# Patient Record
Sex: Male | Born: 1985 | Race: White | Hispanic: No | Marital: Single | Smoking: Never smoker
Health system: Southern US, Community
[De-identification: ages and names within clinical notes are randomized; demographics above are authoritative.]

---
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
## 2007-11-21 ENCOUNTER — Emergency Department: Payer: Self-pay | Admitting: Emergency Medicine

## 2008-05-14 ENCOUNTER — Emergency Department: Payer: Self-pay | Admitting: Emergency Medicine

## 2011-03-12 ENCOUNTER — Emergency Department: Payer: Self-pay | Admitting: Emergency Medicine

## 2017-07-09 ENCOUNTER — Encounter: Payer: Self-pay | Admitting: *Deleted

## 2017-07-09 ENCOUNTER — Emergency Department: Payer: Self-pay

## 2017-07-09 ENCOUNTER — Emergency Department
Admission: EM | Admit: 2017-07-09 | Discharge: 2017-07-09 | Disposition: A | Discharge status: Home / Self Care | Payer: Self-pay | Attending: Student in an Organized Health Care Education/Training Program | Admitting: Student in an Organized Health Care Education/Training Program

## 2017-07-09 DIAGNOSIS — W230XXA Caught, crushed, jammed, or pinched between moving objects, initial encounter: Secondary | ICD-10-CM | POA: Insufficient documentation

## 2017-07-09 DIAGNOSIS — S62642A Nondisplaced fracture of proximal phalanx of right middle finger, initial encounter for closed fracture: Secondary | ICD-10-CM | POA: Insufficient documentation

## 2017-07-09 DIAGNOSIS — S62652A Nondisplaced fracture of medial phalanx of right middle finger, initial encounter for closed fracture: Secondary | ICD-10-CM

## 2017-07-09 DIAGNOSIS — Y9289 Other specified places as the place of occurrence of the external cause: Secondary | ICD-10-CM | POA: Insufficient documentation

## 2017-07-09 DIAGNOSIS — S6721XA Crushing injury of right hand, initial encounter: Secondary | ICD-10-CM | POA: Insufficient documentation

## 2017-07-09 DIAGNOSIS — Y990 Civilian activity done for income or pay: Secondary | ICD-10-CM | POA: Insufficient documentation

## 2017-07-09 DIAGNOSIS — Y9389 Activity, other specified: Secondary | ICD-10-CM | POA: Insufficient documentation

## 2017-07-09 MED ORDER — MELOXICAM 7.5 MG PO TABS: 7.5000 mg | ORAL_TABLET | Freq: Every day | ORAL | 0 refills | Status: AC

## 2017-07-09 MED ORDER — HYDROCODONE-ACETAMINOPHEN 5-325 MG PO TABS: 1.0000 | ORAL_TABLET | Freq: Four times a day (QID) | ORAL | 0 refills | Status: AC | PRN

## 2017-07-09 MED ORDER — HYDROCODONE-ACETAMINOPHEN 5-325 MG PO TABS
2.0000 | ORAL_TABLET | Freq: Once | ORAL | Status: AC
  Administered 2017-07-09: 2 via ORAL
  Filled 2017-07-09: qty 2

## 2017-07-09 NOTE — ED Notes (Signed)
Pt spouse signed paperwork due to pt injury to dominant hand

## 2017-07-09 NOTE — Discharge Instructions (Signed)
Ice and elevate right hand to reduce swelling. Take Norco as needed for pain every 6 hours. He may not drive or operate machinery while taking this medication. Also meloxicam 1 daily for inflammation. Call and make an appointment with Dr. Allena KatzPatel at [facility]. The splint on until seen by the orthopedist. A note was written for you to give to your workplace stating that you would have limited use of your right hand due to injury.

## 2017-07-09 NOTE — ED Triage Notes (Signed)
Pt's right hand got caught in between a vice at work, hand is swollen and painful with a few abrasions

## 2017-07-09 NOTE — ED Notes (Signed)
See triage note  States he got his right hand caught between a machine part and a vice  Swelling noted across knuckles    Good pulses and sensation

## 2017-07-09 NOTE — ED Provider Notes (Signed)
[facility] Emergency Department Provider Note   ____________________________________________   First MD Initiated Contact with Patient 07/09/17 1255     (approximate)  I have reviewed the triage vital signs and the nursing notes.   HISTORY  Chief Complaint Hand Injury   HPI Dale Mack is a 31 y.o. male is here after having a injury at work. Patient got his hand caught by a type of fight prescription on a machine prior to his arrival.Patient has several abrasions. He states that initially he could move all his fingers without difficulty but now that they're swollen and is more painful and difficult. Currently rates his pain as 10 over 10.  History reviewed. No pertinent past medical history.  There are no active problems to display for this patient.   History reviewed. No pertinent surgical history.  Prior to Admission medications   Medication Sig Start Date End Date Taking? Authorizing Provider  HYDROcodone-acetaminophen (NORCO/VICODIN) 5-325 MG tablet Take 1 tablet by mouth every 6 (six) hours as needed for moderate pain. 07/09/17   Tommi Rumps, PA-C  meloxicam (MOBIC) 7.5 MG tablet Take 1 tablet (7.5 mg total) by mouth daily. 07/09/17 07/09/18  Tommi Rumps, PA-C    Allergies Patient has no known allergies.  No family history on file.  Social History Social History  Substance Use Topics  . Smoking status: Not on file  . Smokeless tobacco: Not on file  . Alcohol use Not on file    Review of Systems Constitutional: No fever/chills Cardiovascular: Denies chest pain. Respiratory: Denies shortness of breath. Gastrointestinal:  No nausea, no vomiting.   Musculoskeletal: Positive right hand pain. Skin: Positive for superficial abrasions right hand. Neurological: Negative for  focal weakness or numbness. ____________________________________________   PHYSICAL EXAM:  VITAL SIGNS: ED Triage Vitals  Enc Vitals Group     BP  07/09/17 1215 118/74     Pulse Rate 07/09/17 1215 (!) 52     Resp 07/09/17 1215 20     Temp 07/09/17 1215 98 F (36.7 C)     Temp Source 07/09/17 1215 Oral     SpO2 07/09/17 1215 98 %     Weight 07/09/17 1217 160 lb (72.6 kg)     Height 07/09/17 1217  (1.676 m)     Head Circumference --      Peak Flow --      Pain Score 07/09/17 1215 10     Pain Loc --      Pain Edu? --      Excl. in GC? --    Constitutional: Alert and oriented. Well appearing and in no acute distress. Eyes: Conjunctivae are normal.  Head: Atraumatic. Neck: No stridor.   Cardiovascular: Normal rate, regular rhythm. Grossly normal heart sounds.  Good peripheral circulation. Respiratory: Normal respiratory effort.  No retractions. Lungs CTAB. Musculoskeletal: There is soft tissue use swelling present and multiple digits on the right hand. There is some superficial abrasions noted to the distal digits without active bleeding. Capillary refill is present and less than 3 seconds. Motor sensory function intact. Patient is able to flex and extend to some degree due to the swelling. The third digit is more tender and swollen in comparison to the others. Neurologic:  Normal speech and language. No gross focal neurologic deficits are appreciated.  Skin:  Skin is warm, dry. Abrasions as noted above. Psychiatric: Mood and affect are normal. Speech and behavior are normal.  ____________________________________________   LABS (all  labs ordered are listed, but only abnormal results are displayed)  Labs Reviewed - No data to display   RADIOLOGY  Dg Hand Complete Right  Result Date: 07/09/2017 CLINICAL DATA:  Injured with hand stuck in vice today, pain around the metacarpal heads EXAM: RIGHT HAND - COMPLETE 3+ VIEW COMPARISON:  Right hand films of 11/21/2007 FINDINGS: There is a small fracture fragment from the base of the middle phalanx of the right third digit on the palmar aspect. No other acute abnormality is seen.  Joint spaces appear normal. The carpal bones are in normal position IMPRESSION: Small fracture fragment from the base of the middle phalanx of the right third digit. Electronically Signed   By: Dwyane DeePaul  Barry M.D.   On: 07/09/2017 13:23    ____________________________________________   PROCEDURES  Procedure(s) performed: None  Procedures  Critical Care performed: No  ____________________________________________   INITIAL IMPRESSION / ASSESSMENT AND PLAN / ED COURSE  Pertinent labs & imaging results that were available during my care of the patient were reviewed by me and considered in my medical decision making (see chart for details).  Patient was given information for crush injury and also we talked about his fracture of his right middle finger. He is follow-up with Dr. Allena KatzPatel. Until that time he is to ice and elevate his hand to reduce swelling. He was placed in a volar OCL splint. Patient was given Norco in the department also with a prescription. He was given a note to take to work stating he has limited use of his right hand due to his injury.   ___________________________________________   FINAL CLINICAL IMPRESSION(S) / ED DIAGNOSES  Final diagnoses:  Crushing injury of right hand, initial encounter  Closed nondisplaced fracture of middle phalanx of right middle finger, initial encounter      NEW MEDICATIONS STARTED DURING THIS VISIT:  Discharge Medication List as of 07/09/2017  2:23 PM    START taking these medications   Details  HYDROcodone-acetaminophen (NORCO/VICODIN) 5-325 MG tablet Take 1 tablet by mouth every 6 (six) hours as needed for moderate pain., Starting Thu 07/09/2017, Print    meloxicam (MOBIC) 7.5 MG tablet Take 1 tablet (7.5 mg total) by mouth daily., Starting Thu 07/09/2017, Until Fri 07/09/2018, Print         Note:  This document was prepared using Dragon voice recognition software and may include unintentional dictation errors.    Tommi RumpsSummers,  Jaidev Sanger L, PA-C 07/09/17 1556    Willy Eddyobinson, Patrick, MD 07/10/17 [phone]

## 2018-11-22 ENCOUNTER — Other Ambulatory Visit: Payer: Self-pay

## 2018-11-22 ENCOUNTER — Emergency Department
Admission: EM | Admit: 2018-11-22 | Discharge: 2018-11-22 | Disposition: A | Discharge status: Home / Self Care | Payer: Self-pay | Attending: Emergency Medicine | Admitting: Emergency Medicine

## 2018-11-22 DIAGNOSIS — J209 Acute bronchitis, unspecified: Secondary | ICD-10-CM

## 2018-11-22 MED ORDER — IPRATROPIUM-ALBUTEROL 0.5-2.5 (3) MG/3ML IN SOLN
3.0000 mL | Freq: Once | RESPIRATORY_TRACT | Status: AC
  Administered 2018-11-22: 3 mL via RESPIRATORY_TRACT
  Filled 2018-11-22: qty 3

## 2018-11-22 MED ORDER — NAPROXEN 500 MG PO TABS: 500.0000 mg | ORAL_TABLET | Freq: Two times a day (BID) | ORAL | 0 refills | Status: AC

## 2018-11-22 MED ORDER — ALBUTEROL SULFATE HFA 108 (90 BASE) MCG/ACT IN AERS: 2.0000 | INHALATION_SPRAY | RESPIRATORY_TRACT | 0 refills | Status: AC | PRN

## 2018-11-22 MED ORDER — PREDNISONE 20 MG PO TABS: 40.0000 mg | ORAL_TABLET | Freq: Every day | ORAL | 0 refills | Status: AC

## 2018-11-22 MED ORDER — PREDNISONE 20 MG PO TABS
60.0000 mg | ORAL_TABLET | Freq: Once | ORAL | Status: AC
  Administered 2018-11-22: 60 mg via ORAL
  Filled 2018-11-22: qty 3

## 2018-11-22 NOTE — ED Provider Notes (Signed)
[facility] Emergency Department Provider Note  ____________________________________________  Time seen: Approximately 5:41 AM  I have reviewed the triage vital signs and the nursing notes.   HISTORY  Chief Complaint Cough    HPI Dale Mack is a 33 y.o. male with no significant past medical history who complains of frequent cough.  Denies any significant chest pain or shortness of breath.  No fevers or chills.  No vomiting and diarrhea, but the frequent cough is associated with causing some discomfort or posttussive emesis.  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   Symptoms are intermittent, worse in cold air, no alleviating factors.  Denies radiating pain.     Past medical history noncontributory  Past surgical history noncontributory   There are no active problems to display for this patient.       Prior to Admission medications   Medication Sig Start Date End Date Taking? Authorizing Provider  albuterol (PROVENTIL HFA) 108 (90 Base) MCG/ACT inhaler Inhale 2 puffs into the lungs every 4 (four) hours as needed for wheezing or shortness of breath. 11/22/18   Sharman Cheek, MD  HYDROcodone-acetaminophen (NORCO/VICODIN) 5-325 MG tablet Take 1 tablet by mouth every 6 (six) hours as needed for moderate pain. 07/09/17   Tommi Rumps, PA-C  naproxen (NAPROSYN) 500 MG tablet Take 1 tablet (500 mg total) by mouth 2 (two) times daily with a meal. 11/22/18   Sharman Cheek, MD  predniSONE (DELTASONE) 20 MG tablet Take 2 tablets (40 mg total) by mouth daily. 11/22/18   Sharman Cheek, MD  No prior medications   Allergies Patient has no known allergies.   No family history on file.  Social History Social History   Tobacco Use  . Smoking status: Not on file  Substance Use Topics  . Alcohol use: Not on file  . Drug use: Not on file  No tobacco or alcohol  use  Review of Systems  Constitutional:   No fever or chills.  ENT:   Positive rhinorrhea.  Positive sore throat. Cardiovascular:   No chest pain or syncope. Respiratory:   No dyspnea positive cough. Gastrointestinal:   Negative for abdominal pain, vomiting and diarrhea.  Musculoskeletal:   Negative for focal pain or swelling All other systems reviewed and are negative except as documented above in ROS and HPI.  ____________________________________________   PHYSICAL EXAM:  VITAL SIGNS: ED Triage Vitals  Enc Vitals Group     BP 11/22/18 0353 124/87     Pulse Rate 11/22/18 0353 65     Resp 11/22/18 0353 16     Temp 11/22/18 0353 98.1 F (36.7 C)     Temp Source 11/22/18 0353 Oral     SpO2 11/22/18 0353 98 %     Weight 11/22/18 0354 160 lb (72.6 kg)     Height 11/22/18 0354 5\' 10"  (1.778 m)     Head Circumference --      Peak Flow --      Pain Score 11/22/18 0354 0     Pain Loc --      Pain Edu? --      Excl. in GC? --     Vital signs reviewed, nursing assessments reviewed.   Constitutional:   Alert and oriented. Non-toxic appearance.   Eyes:   Conjunctivae are normal. EOMI. PERRL. ENT      Head:   Normocephalic and atraumatic.  Nose:   Positive rhinorrhea.       Mouth/Throat:   MMM, positive pharyngeal erythema.  Positive tonsillar erythema without peritonsillar mass.       Neck:   No meningismus. Full ROM. Hematological/Lymphatic/Immunilogical:   No cervical lymphadenopathy. Cardiovascular:   RRR. Symmetric bilateral radial and DP pulses.  No murmurs. Cap refill less than 2 seconds. Respiratory:   Normal respiratory effort without tachypnea/retractions. Breath sounds are clear and equal bilaterally. No wheezes/rales/rhonchi.  There is inducible wheezing with FEV1 maneuver. Gastrointestinal:   Soft and nontender. Non distended. There is no CVA tenderness.  No rebound, rigidity, or guarding. Musculoskeletal:   Normal range of motion in all extremities. No joint  effusions.  No lower extremity tenderness.  No edema. Neurologic:   Normal speech and language.  Motor grossly intact. No acute focal neurologic deficits are appreciated.  Skin:    Skin is warm, dry and intact. No rash noted.  No petechiae, purpura, or bullae.  ____________________________________________    LABS (pertinent positives/negatives) (all labs ordered are listed, but only abnormal results are displayed) Labs Reviewed - No data to display ____________________________________________   EKG    ____________________________________________    RADIOLOGY  No results found.  ____________________________________________   PROCEDURES Procedures  ____________________________________________    CLINICAL IMPRESSION / ASSESSMENT AND PLAN / ED COURSE  Pertinent labs & imaging results that were available during my care of the patient were reviewed by me and considered in my medical decision making (see chart for details).    Patient presents with cough and evidence of bronchospasm consistent with acute bronchitis.  The history of multiple sick contacts in his workplace also consistent with a viral respiratory illness.  No fever, vital signs normal without the use of antipyretics.  Lung exam is not suggestive of consolidation.  Antibiotics not indicated at this time.  Will treat with prednisone and albuterol.  NSAIDs.  Follow-up with primary care.  Work note provided.  Doubt ACS PE dissection AAA carditis or pneumothorax.      ____________________________________________   FINAL CLINICAL IMPRESSION(S) / ED DIAGNOSES    Final diagnoses:  Acute bronchitis, unspecified organism     ED Discharge Orders         Ordered    albuterol (PROVENTIL HFA) 108 (90 Base) MCG/ACT inhaler  Every 4 hours PRN     11/22/18 0540    predniSONE (DELTASONE) 20 MG tablet  Daily     11/22/18 0540    naproxen (NAPROSYN) 500 MG tablet  2 times daily with meals     11/22/18 0540           Portions of this note were generated with dragon dictation software. Dictation errors may occur despite best attempts at proofreading.   Sharman CheekStafford, Britnay Magnussen, MD 11/22/18 [phone]

## 2018-11-22 NOTE — ED Triage Notes (Signed)
Pt states a week of cough. Pt states he is "vomiting up mucus". Pt appears in no acute distress, masked in triage. Pt denies abdominal pain, diarrhea known fever.

## 2020-05-20 ENCOUNTER — Emergency Department (HOSPITAL_COMMUNITY): Payer: Self-pay

## 2020-05-20 ENCOUNTER — Observation Stay (HOSPITAL_COMMUNITY)
Admission: EM | Admit: 2020-05-20 | Discharge: 2020-05-21 | Disposition: A | Discharge status: Home / Self Care | Payer: Self-pay | Attending: Orthopedic Surgery | Admitting: Orthopedic Surgery

## 2020-05-20 ENCOUNTER — Emergency Department (HOSPITAL_COMMUNITY): Payer: Self-pay | Admitting: Anesthesiology

## 2020-05-20 ENCOUNTER — Encounter (HOSPITAL_COMMUNITY): Payer: Self-pay

## 2020-05-20 ENCOUNTER — Encounter (HOSPITAL_COMMUNITY)
Admission: EM | Disposition: A | Discharge status: Home / Self Care | Payer: Self-pay | Source: Home / Self Care | Attending: Emergency Medicine

## 2020-05-20 ENCOUNTER — Other Ambulatory Visit: Payer: Self-pay

## 2020-05-20 DIAGNOSIS — S52392A Other fracture of shaft of radius, left arm, initial encounter for closed fracture: Principal | ICD-10-CM | POA: Insufficient documentation

## 2020-05-20 DIAGNOSIS — S52292A Other fracture of shaft of left ulna, initial encounter for closed fracture: Secondary | ICD-10-CM | POA: Insufficient documentation

## 2020-05-20 DIAGNOSIS — S52202B Unspecified fracture of shaft of left ulna, initial encounter for open fracture type I or II: Secondary | ICD-10-CM | POA: Diagnosis present

## 2020-05-20 DIAGNOSIS — Y9389 Activity, other specified: Secondary | ICD-10-CM | POA: Insufficient documentation

## 2020-05-20 DIAGNOSIS — Y929 Unspecified place or not applicable: Secondary | ICD-10-CM | POA: Insufficient documentation

## 2020-05-20 DIAGNOSIS — S5292XB Unspecified fracture of left forearm, initial encounter for open fracture type I or II: Secondary | ICD-10-CM

## 2020-05-20 DIAGNOSIS — Y999 Unspecified external cause status: Secondary | ICD-10-CM | POA: Insufficient documentation

## 2020-05-20 DIAGNOSIS — S52302B Unspecified fracture of shaft of left radius, initial encounter for open fracture type I or II: Secondary | ICD-10-CM | POA: Diagnosis present

## 2020-05-20 DIAGNOSIS — W19XXXA Unspecified fall, initial encounter: Secondary | ICD-10-CM

## 2020-05-20 DIAGNOSIS — Z20822 Contact with and (suspected) exposure to covid-19: Secondary | ICD-10-CM | POA: Insufficient documentation

## 2020-05-20 HISTORY — PX: I & D EXTREMITY: SHX5045

## 2020-05-20 HISTORY — PX: ORIF ULNAR FRACTURE: SHX5417

## 2020-05-20 LAB — CBC WITH DIFFERENTIAL/PLATELET
Abs Immature Granulocytes: 0.05 10*3/uL (ref 0.00–0.07)
Basophils Absolute: 0 10*3/uL (ref 0.0–0.1)
Basophils Relative: 0 %
Eosinophils Absolute: 0 10*3/uL (ref 0.0–0.5)
Eosinophils Relative: 0 %
HCT: 42.9 % (ref 39.0–52.0)
Hemoglobin: 15.3 g/dL (ref 13.0–17.0)
Immature Granulocytes: 0 %
Lymphocytes Relative: 7 %
Lymphs Abs: 1 10*3/uL (ref 0.7–4.0)
MCH: 29.5 pg (ref 26.0–34.0)
MCHC: 35.7 g/dL (ref 30.0–36.0)
MCV: 82.8 fL (ref 80.0–100.0)
Monocytes Absolute: 1.1 10*3/uL — ABNORMAL HIGH (ref 0.1–1.0)
Monocytes Relative: 7 %
Neutro Abs: 12.4 10*3/uL — ABNORMAL HIGH (ref 1.7–7.7)
Neutrophils Relative %: 86 %
Platelets: 236 10*3/uL (ref 150–400)
RBC: 5.18 MIL/uL (ref 4.22–5.81)
RDW: 12.3 % (ref 11.5–15.5)
WBC: 14.6 10*3/uL — ABNORMAL HIGH (ref 4.0–10.5)
nRBC: 0 % (ref 0.0–0.2)

## 2020-05-20 LAB — BASIC METABOLIC PANEL
Anion gap: 8 (ref 5–15)
BUN: 12 mg/dL (ref 6–20)
CO2: 20 mmol/L — ABNORMAL LOW (ref 22–32)
Calcium: 7.7 mg/dL — ABNORMAL LOW (ref 8.9–10.3)
Chloride: 109 mmol/L (ref 98–111)
Creatinine, Ser: 1.16 mg/dL (ref 0.61–1.24)
GFR calc Af Amer: >60 mL/min (ref >60)
GFR calc non Af Amer: >60 mL/min (ref >60)
Glucose, Bld: 133 mg/dL — ABNORMAL HIGH (ref 70–99)
Potassium: 4.2 mmol/L (ref 3.5–5.1)
Sodium: 137 mmol/L (ref 135–145)

## 2020-05-20 LAB — ETHANOL: Alcohol, Ethyl (B): <10 mg/dL (ref <10)

## 2020-05-20 LAB — PROTIME-INR
INR: 1.1 (ref 0.8–1.2)
Prothrombin Time: 13.5 seconds (ref 11.4–15.2)

## 2020-05-20 LAB — SARS CORONAVIRUS 2 BY RT PCR (HOSPITAL ORDER, PERFORMED IN ~~LOC~~ HOSPITAL LAB): SARS Coronavirus 2: NEGATIVE

## 2020-05-20 SURGERY — IRRIGATION AND DEBRIDEMENT EXTREMITY
Anesthesia: General | Site: Wrist | Laterality: Left

## 2020-05-20 MED ORDER — METHOCARBAMOL 1000 MG/10ML IJ SOLN
500.0000 mg | Freq: Four times a day (QID) | INTRAVENOUS | Status: DC | PRN
  Filled 2020-05-20: qty 5

## 2020-05-20 MED ORDER — METOCLOPRAMIDE HCL 5 MG PO TABS: 5.0000 mg | ORAL_TABLET | Freq: Three times a day (TID) | ORAL | Status: DC | PRN

## 2020-05-20 MED ORDER — TETANUS-DIPHTH-ACELL PERTUSSIS 5-2.5-18.5 LF-MCG/0.5 IM SUSP
0.5000 mL | Freq: Once | INTRAMUSCULAR | Status: AC
  Administered 2020-05-20: 0.5 mL via INTRAMUSCULAR
  Filled 2020-05-20: qty 0.5

## 2020-05-20 MED ORDER — CHLORHEXIDINE GLUCONATE 4 % EX LIQD
60.0000 mL | Freq: Once | CUTANEOUS | Status: DC
  Filled 2020-05-20: qty 60

## 2020-05-20 MED ORDER — ARTIFICIAL TEARS OPHTHALMIC OINT
TOPICAL_OINTMENT | OPHTHALMIC | Status: AC
  Filled 2020-05-20: qty 3.5

## 2020-05-20 MED ORDER — SUCCINYLCHOLINE CHLORIDE 20 MG/ML IJ SOLN
INTRAMUSCULAR | Status: DC | PRN
Start: 2020-05-20 — End: 2020-05-20
  Administered 2020-05-20: 120 mg via INTRAVENOUS

## 2020-05-20 MED ORDER — MIDAZOLAM HCL 2 MG/2ML IJ SOLN
INTRAMUSCULAR | Status: AC
  Filled 2020-05-20: qty 2

## 2020-05-20 MED ORDER — BUPIVACAINE HCL (PF) 0.25 % IJ SOLN
INTRAMUSCULAR | Status: AC
  Filled 2020-05-20: qty 30

## 2020-05-20 MED ORDER — SODIUM CHLORIDE 0.9 % IR SOLN
Status: DC | PRN
  Administered 2020-05-20: 3000 mL

## 2020-05-20 MED ORDER — LACTATED RINGERS IV SOLN: INTRAVENOUS | Status: DC | PRN

## 2020-05-20 MED ORDER — CEFAZOLIN SODIUM-DEXTROSE 2-4 GM/100ML-% IV SOLN: 2.0000 g | INTRAVENOUS | Status: DC

## 2020-05-20 MED ORDER — MIDAZOLAM HCL 5 MG/5ML IJ SOLN
INTRAMUSCULAR | Status: DC | PRN
  Administered 2020-05-20: 2 mg via INTRAVENOUS

## 2020-05-20 MED ORDER — FENTANYL CITRATE (PF) 100 MCG/2ML IJ SOLN
INTRAMUSCULAR | Status: DC | PRN
  Administered 2020-05-20 (×3): 50 ug via INTRAVENOUS

## 2020-05-20 MED ORDER — PHENYLEPHRINE HCL-NACL 10-0.9 MG/250ML-% IV SOLN
INTRAVENOUS | Status: DC | PRN
  Administered 2020-05-20: 25 ug/min via INTRAVENOUS

## 2020-05-20 MED ORDER — PHENYLEPHRINE 40 MCG/ML (10ML) SYRINGE FOR IV PUSH (FOR BLOOD PRESSURE SUPPORT)
PREFILLED_SYRINGE | INTRAVENOUS | Status: AC
  Filled 2020-05-20: qty 20

## 2020-05-20 MED ORDER — POVIDONE-IODINE 10 % EX SWAB: 2.0000 "application " | Freq: Once | CUTANEOUS | Status: DC

## 2020-05-20 MED ORDER — PROMETHAZINE HCL 25 MG/ML IJ SOLN: 6.2500 mg | INTRAMUSCULAR | Status: DC | PRN

## 2020-05-20 MED ORDER — DEXAMETHASONE SODIUM PHOSPHATE 10 MG/ML IJ SOLN
INTRAMUSCULAR | Status: DC | PRN
  Administered 2020-05-20: 5 mg via INTRAVENOUS

## 2020-05-20 MED ORDER — EPHEDRINE SULFATE 50 MG/ML IJ SOLN
INTRAMUSCULAR | Status: DC | PRN
Start: 2020-05-20 — End: 2020-05-20
  Administered 2020-05-20: 5 mg via INTRAVENOUS

## 2020-05-20 MED ORDER — ONDANSETRON HCL 4 MG/2ML IJ SOLN
INTRAMUSCULAR | Status: DC | PRN
  Administered 2020-05-20: 4 mg via INTRAVENOUS

## 2020-05-20 MED ORDER — MORPHINE SULFATE (PF) 2 MG/ML IV SOLN: 0.5000 mg | INTRAVENOUS | Status: DC | PRN

## 2020-05-20 MED ORDER — CLONIDINE HCL (ANALGESIA) 100 MCG/ML EP SOLN
EPIDURAL | Status: DC | PRN
  Administered 2020-05-20: 100 ug

## 2020-05-20 MED ORDER — FENTANYL CITRATE (PF) 250 MCG/5ML IJ SOLN
INTRAMUSCULAR | Status: AC
  Filled 2020-05-20: qty 5

## 2020-05-20 MED ORDER — METRONIDAZOLE IN NACL 5-0.79 MG/ML-% IV SOLN
500.0000 mg | Freq: Three times a day (TID) | INTRAVENOUS | Status: AC
  Administered 2020-05-20 – 2020-05-21 (×3): 500 mg via INTRAVENOUS
  Filled 2020-05-20 (×3): qty 100

## 2020-05-20 MED ORDER — ACETAMINOPHEN 500 MG PO TABS: 1000.0000 mg | ORAL_TABLET | Freq: Once | ORAL | Status: DC

## 2020-05-20 MED ORDER — LIDOCAINE 2% (20 MG/ML) 5 ML SYRINGE
INTRAMUSCULAR | Status: AC
  Filled 2020-05-20: qty 5

## 2020-05-20 MED ORDER — EPHEDRINE 5 MG/ML INJ
INTRAVENOUS | Status: AC
  Filled 2020-05-20: qty 10

## 2020-05-20 MED ORDER — NAPROXEN 250 MG PO TABS
250.0000 mg | ORAL_TABLET | Freq: Two times a day (BID) | ORAL | Status: DC
  Administered 2020-05-21 (×2): 250 mg via ORAL
  Filled 2020-05-20 (×2): qty 1

## 2020-05-20 MED ORDER — ONDANSETRON HCL 4 MG PO TABS: 4.0000 mg | ORAL_TABLET | Freq: Four times a day (QID) | ORAL | Status: DC | PRN

## 2020-05-20 MED ORDER — PROPOFOL 10 MG/ML IV BOLUS
INTRAVENOUS | Status: DC | PRN
  Administered 2020-05-20: 180 mg via INTRAVENOUS

## 2020-05-20 MED ORDER — FENTANYL CITRATE (PF) 100 MCG/2ML IJ SOLN: 25.0000 ug | INTRAMUSCULAR | Status: DC | PRN

## 2020-05-20 MED ORDER — PHENYLEPHRINE HCL (PRESSORS) 10 MG/ML IV SOLN
INTRAVENOUS | Status: DC | PRN
  Administered 2020-05-20 (×3): 40 ug via INTRAVENOUS
  Administered 2020-05-20: 120 ug via INTRAVENOUS
  Administered 2020-05-20 (×2): 80 ug via INTRAVENOUS

## 2020-05-20 MED ORDER — DOCUSATE SODIUM 100 MG PO CAPS
100.0000 mg | ORAL_CAPSULE | Freq: Two times a day (BID) | ORAL | Status: DC
  Administered 2020-05-21: 100 mg via ORAL
  Filled 2020-05-20: qty 1

## 2020-05-20 MED ORDER — PROPOFOL 10 MG/ML IV BOLUS
INTRAVENOUS | Status: AC
  Filled 2020-05-20: qty 40

## 2020-05-20 MED ORDER — HYDROMORPHONE HCL 1 MG/ML IJ SOLN
1.0000 mg | Freq: Once | INTRAMUSCULAR | Status: AC
  Administered 2020-05-20: 1 mg via INTRAVENOUS
  Filled 2020-05-20: qty 1

## 2020-05-20 MED ORDER — CEFAZOLIN SODIUM-DEXTROSE 2-4 GM/100ML-% IV SOLN
2.0000 g | Freq: Once | INTRAVENOUS | Status: AC
  Administered 2020-05-20: 2 g via INTRAVENOUS
  Filled 2020-05-20: qty 100

## 2020-05-20 MED ORDER — HYDROCODONE-ACETAMINOPHEN 7.5-325 MG PO TABS
1.0000 | ORAL_TABLET | ORAL | Status: DC | PRN
  Administered 2020-05-21: 1 via ORAL
  Filled 2020-05-20: qty 1

## 2020-05-20 MED ORDER — METHOCARBAMOL 500 MG PO TABS: 500.0000 mg | ORAL_TABLET | Freq: Four times a day (QID) | ORAL | Status: DC | PRN

## 2020-05-20 MED ORDER — SUCCINYLCHOLINE CHLORIDE 200 MG/10ML IV SOSY
PREFILLED_SYRINGE | INTRAVENOUS | Status: AC
  Filled 2020-05-20: qty 10

## 2020-05-20 MED ORDER — METOCLOPRAMIDE HCL 5 MG/ML IJ SOLN: 5.0000 mg | Freq: Three times a day (TID) | INTRAMUSCULAR | Status: DC | PRN

## 2020-05-20 MED ORDER — ROPIVACAINE HCL 5 MG/ML IJ SOLN
INTRAMUSCULAR | Status: DC | PRN
  Administered 2020-05-20: 30 mL via PERINEURAL

## 2020-05-20 MED ORDER — ACETAMINOPHEN 325 MG PO TABS: 325.0000 mg | ORAL_TABLET | Freq: Four times a day (QID) | ORAL | Status: DC | PRN

## 2020-05-20 MED ORDER — ONDANSETRON HCL 4 MG/2ML IJ SOLN: 4.0000 mg | Freq: Four times a day (QID) | INTRAMUSCULAR | Status: DC | PRN

## 2020-05-20 MED ORDER — DEXAMETHASONE SODIUM PHOSPHATE 10 MG/ML IJ SOLN
INTRAMUSCULAR | Status: AC
  Filled 2020-05-20: qty 1

## 2020-05-20 MED ORDER — CEFAZOLIN SODIUM-DEXTROSE 2-4 GM/100ML-% IV SOLN
2.0000 g | Freq: Three times a day (TID) | INTRAVENOUS | Status: AC
  Administered 2020-05-20 – 2020-05-21 (×3): 2 g via INTRAVENOUS
  Filled 2020-05-20 (×3): qty 100

## 2020-05-20 MED ORDER — HYDROCODONE-ACETAMINOPHEN 5-325 MG PO TABS: 1.0000 | ORAL_TABLET | ORAL | Status: DC | PRN

## 2020-05-20 MED ORDER — SODIUM CHLORIDE 0.9 % IR SOLN
Status: DC | PRN
  Administered 2020-05-20: 1000 mL

## 2020-05-20 MED ORDER — ONDANSETRON HCL 4 MG/2ML IJ SOLN
INTRAMUSCULAR | Status: AC
  Filled 2020-05-20: qty 2

## 2020-05-20 MED ORDER — GENTAMICIN SULFATE 40 MG/ML IJ SOLN
1.5000 mg/kg | Freq: Once | INTRAVENOUS | Status: AC
  Administered 2020-05-20: 100 mg via INTRAVENOUS
  Filled 2020-05-20: qty 2.5

## 2020-05-20 MED ORDER — LIDOCAINE 2% (20 MG/ML) 5 ML SYRINGE
INTRAMUSCULAR | Status: DC | PRN
  Administered 2020-05-20: 80 mg via INTRAVENOUS

## 2020-05-20 MED ORDER — ACETAMINOPHEN 500 MG PO TABS
500.0000 mg | ORAL_TABLET | Freq: Four times a day (QID) | ORAL | Status: AC
  Administered 2020-05-20 – 2020-05-21 (×4): 500 mg via ORAL
  Filled 2020-05-20 (×4): qty 1

## 2020-05-20 SURGICAL SUPPLY — 79 items
BIT DRILL 2.5X110 QC LCP DISP (BIT) ×3 IMPLANT
BLADE SURG 10 STRL SS (BLADE) ×3 IMPLANT
BNDG COHESIVE 4X5 TAN STRL (GAUZE/BANDAGES/DRESSINGS) ×3 IMPLANT
BNDG CONFORM 3 STRL LF (GAUZE/BANDAGES/DRESSINGS) ×3 IMPLANT
BNDG ELASTIC 3X5.8 VLCR STR LF (GAUZE/BANDAGES/DRESSINGS) ×3 IMPLANT
BNDG ELASTIC 4X5.8 VLCR STR LF (GAUZE/BANDAGES/DRESSINGS) ×3 IMPLANT
BNDG ESMARK 4X9 LF (GAUZE/BANDAGES/DRESSINGS) ×3 IMPLANT
CORD BIPOLAR FORCEPS 12FT (ELECTRODE) ×3 IMPLANT
COVER SURGICAL LIGHT HANDLE (MISCELLANEOUS) ×3 IMPLANT
CUFF TOURN SGL QUICK 18X4 (TOURNIQUET CUFF) ×3 IMPLANT
CUFF TOURN SGL QUICK 24 (TOURNIQUET CUFF)
CUFF TRNQT CYL 24X4X16.5-23 (TOURNIQUET CUFF) IMPLANT
DRAPE IMP U-DRAPE 54X76 (DRAPES) IMPLANT
DRAPE INCISE IOBAN 66X45 STRL (DRAPES) ×12 IMPLANT
DRAPE OEC MINIVIEW 54X84 (DRAPES) ×3 IMPLANT
DRAPE SURG 17X23 STRL (DRAPES) ×3 IMPLANT
DRAPE U-SHAPE 47X51 STRL (DRAPES) ×3 IMPLANT
DRSG ADAPTIC 3X8 NADH LF (GAUZE/BANDAGES/DRESSINGS) ×3 IMPLANT
DRSG XEROFORM 1X8 (GAUZE/BANDAGES/DRESSINGS) ×3 IMPLANT
DURAPREP 26ML APPLICATOR (WOUND CARE) ×3 IMPLANT
ELECT CAUTERY BLADE 6.4 (BLADE) ×3 IMPLANT
ELECT REM PT RETURN 9FT ADLT (ELECTROSURGICAL)
ELECTRODE REM PT RTRN 9FT ADLT (ELECTROSURGICAL) IMPLANT
GAUZE SPONGE 4X4 12PLY STRL (GAUZE/BANDAGES/DRESSINGS) ×3 IMPLANT
GLOVE BIO SURGEON STRL SZ7.5 (GLOVE) ×3 IMPLANT
GLOVE BIOGEL M 8.0 STRL (GLOVE) ×9 IMPLANT
GLOVE BIOGEL PI IND STRL 8 (GLOVE) ×2 IMPLANT
GLOVE BIOGEL PI INDICATOR 8 (GLOVE) ×4
GLOVE SS BIOGEL STRL SZ 8 (GLOVE) ×1 IMPLANT
GLOVE SUPERSENSE BIOGEL SZ 8 (GLOVE) ×2
GOWN STRL REUS W/ TWL LRG LVL3 (GOWN DISPOSABLE) ×2 IMPLANT
GOWN STRL REUS W/ TWL XL LVL3 (GOWN DISPOSABLE) ×1 IMPLANT
GOWN STRL REUS W/TWL LRG LVL3 (GOWN DISPOSABLE) ×4
GOWN STRL REUS W/TWL XL LVL3 (GOWN DISPOSABLE) ×3
KIT BASIN OR (CUSTOM PROCEDURE TRAY) ×3 IMPLANT
KIT TURNOVER KIT B (KITS) ×3 IMPLANT
MANIFOLD NEPTUNE II (INSTRUMENTS) ×3 IMPLANT
NEEDLE 22X1 1/2 (OR ONLY) (NEEDLE) IMPLANT
NS IRRIG 1000ML POUR BTL (IV SOLUTION) ×6 IMPLANT
PACK ORTHO EXTREMITY (CUSTOM PROCEDURE TRAY) ×3 IMPLANT
PAD ARMBOARD 7.5X6 YLW CONV (MISCELLANEOUS) ×6 IMPLANT
PAD CAST 4YDX4 CTTN HI CHSV (CAST SUPPLIES) ×1 IMPLANT
PADDING CAST ABS 4INX4YD NS (CAST SUPPLIES) ×4
PADDING CAST ABS COTTON 4X4 ST (CAST SUPPLIES) ×2 IMPLANT
PADDING CAST COTTON 4X4 STRL (CAST SUPPLIES) ×3
PLATE LCP 3.5 7H 98 (Plate) ×3 IMPLANT
PROS LCP PLATE 6H 85MM (Plate) ×3 IMPLANT
PROSTHESIS LCP PLATE 6H 85MM (Plate) ×1 IMPLANT
SCREW CORTEX 3.5 12MM (Screw) ×2 IMPLANT
SCREW CORTEX 3.5 14MM (Screw) ×10 IMPLANT
SCREW CORTEX 3.5 16MM (Screw) ×12 IMPLANT
SCREW LOCK CORT ST 3.5X12 (Screw) ×1 IMPLANT
SCREW LOCK CORT ST 3.5X14 (Screw) ×5 IMPLANT
SCREW LOCK CORT ST 3.5X16 (Screw) ×6 IMPLANT
SET CYSTO W/LG BORE CLAMP LF (SET/KITS/TRAYS/PACK) ×3 IMPLANT
SLING ARM FOAM STRAP LRG (SOFTGOODS) ×3 IMPLANT
SOL PREP POV-IOD 4OZ 10% (MISCELLANEOUS) ×3 IMPLANT
SOL PREP PROV IODINE SCRUB 4OZ (MISCELLANEOUS) ×3 IMPLANT
SPLINT FIBERGLASS 3X12 (CAST SUPPLIES) ×3 IMPLANT
SPONGE LAP 18X18 RF (DISPOSABLE) ×6 IMPLANT
STOCKINETTE IMPERVIOUS 9X36 MD (GAUZE/BANDAGES/DRESSINGS) ×3 IMPLANT
SUT PROLENE 4 0 PS 2 18 (SUTURE) ×12 IMPLANT
SUT VIC AB 2-0 CT1 36 (SUTURE) IMPLANT
SUT VIC AB 3-0 CT1 27 (SUTURE) ×6
SUT VIC AB 3-0 CT1 TAPERPNT 27 (SUTURE) ×2 IMPLANT
SUT VIC AB 3-0 FS2 27 (SUTURE) IMPLANT
SWAB CULTURE ESWAB REG 1ML (MISCELLANEOUS) IMPLANT
SYR BULB IRRIG 60ML STRL (SYRINGE) ×3 IMPLANT
SYR CONTROL 10ML LL (SYRINGE) IMPLANT
TOWEL GREEN STERILE (TOWEL DISPOSABLE) ×3 IMPLANT
TOWEL GREEN STERILE FF (TOWEL DISPOSABLE) ×3 IMPLANT
TRAP DIGIT (INSTRUMENTS) ×6 IMPLANT
TUBE CONNECTING 12'X1/4 (SUCTIONS) ×1
TUBE CONNECTING 12X1/4 (SUCTIONS) ×2 IMPLANT
TUBE EVACUATION TLS (MISCELLANEOUS) IMPLANT
TUBE FEEDING ENTERAL 5FR 16IN (TUBING) IMPLANT
UNDERPAD 30X36 HEAVY ABSORB (UNDERPADS AND DIAPERS) ×6 IMPLANT
WATER STERILE IRR 1000ML POUR (IV SOLUTION) ×3 IMPLANT
YANKAUER SUCT BULB TIP NO VENT (SUCTIONS) ×3 IMPLANT

## 2020-05-20 NOTE — Anesthesia Procedure Notes (Signed)
Procedure Name: Intubation Date/Time: 05/20/2020 4:09 PM Performed by: Edmonia Caprio, CRNA Pre-anesthesia Checklist: Patient identified, Emergency Drugs available, Suction available, Patient being monitored and Timeout performed Patient Re-evaluated:Patient Re-evaluated prior to induction Oxygen Delivery Method: Circle system utilized Preoxygenation: Pre-oxygenation with 100% oxygen Induction Type: IV induction and Rapid sequence Laryngoscope Size: Miller and 2 Grade View: Grade II Tube type: Oral Tube size: 7.5 mm Number of attempts: 1 Airway Equipment and Method: Stylet Placement Confirmation: ETT inserted through vocal cords under direct vision,  positive ETCO2 and breath sounds checked- equal and bilateral Secured at: 23 cm Tube secured with: Tape Dental Injury: Teeth and Oropharynx as per pre-operative assessment

## 2020-05-20 NOTE — ED Notes (Signed)
Ortho tech aware of the need for splint.

## 2020-05-20 NOTE — Anesthesia Procedure Notes (Signed)
Anesthesia Regional Block: Supraclavicular block   Pre-Anesthetic Checklist: ,, timeout performed, Correct Patient, Correct Site, Correct Laterality, Correct Procedure, Correct Position, site marked, Risks and benefits discussed,  Surgical consent,  Pre-op evaluation,  At surgeon's request and post-op pain management  Laterality: Left  Prep: chloraprep       Needles:  Injection technique: Single-shot  Needle Type: Echogenic Needle     Needle Length: 9cm  Needle Gauge: 21     Additional Needles:   Procedures:,,,, ultrasound used (permanent image in chart),,,,  Narrative:  Start time: 05/20/2020 3:47 PM End time: 05/20/2020 3:57 PM Injection made incrementally with aspirations every 5 mL.  Performed by: Personally  Anesthesiologist: Cecile Hearing, MD  Additional Notes: No pain on injection. No increased resistance to injection. Injection made in 5cc increments.  Good needle visualization.  Patient tolerated procedure well.
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
## 2020-05-20 NOTE — ED Notes (Signed)
ED Provider at bedside. 

## 2020-05-20 NOTE — Progress Notes (Signed)
abx given for open left both bone forearm fracture.  Please place in sugartong splint.  Will need urgent I and D with internal fixation.   Will see patient in short stay once covid test complete and images obtrained.

## 2020-05-20 NOTE — Transfer of Care (Signed)
Immediate Anesthesia Transfer of Care Note  Patient: Dale Mack  Procedure(s) Performed: IRRIGATION AND DEBRIDEMENT LEFT ARM (Left Wrist) OPEN REDUCTION INTERNAL FIXATION (ORIF) RADIUS/ULNAR FRACTURE (Left Wrist)  Patient Location: PACU  Anesthesia Type:General  Level of Consciousness: awake, alert  and oriented  Airway & Oxygen Therapy: Patient Spontanous Breathing and Patient connected to nasal cannula oxygen  Post-op Assessment: Report given to RN and Post -op Vital signs reviewed and stable  Post vital signs: Reviewed and stable  Last Vitals:  Vitals Value Taken Time  BP    Temp    Pulse    Resp    SpO2      Last Pain:  Vitals:   05/20/20 1359  TempSrc:   PainSc: 5          Complications: No complications documented.

## 2020-05-20 NOTE — Progress Notes (Signed)
Orthopedic Tech Progress Note Patient Details:  Dale Mack 1986-01-05 [id number]  Ortho Devices Type of Ortho Device: Sugartong splint Ortho Device/Splint Location: ULE Ortho Device/Splint Interventions: Application, Ordered   Post Interventions Patient Tolerated: Well Instructions Provided: Care of device   [name] A [name] 05/20/2020, 1:31 PM
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
## 2020-05-20 NOTE — Brief Op Note (Signed)
05/20/2020  6:28 PM  PATIENT:  Dale Mack  34 y.o. male  PRE-OPERATIVE DIAGNOSIS:  Type II open Left both bone forearm fracture  POST-OPERATIVE DIAGNOSIS:  Same  PROCEDURE:  Procedure(s): IRRIGATION AND DEBRIDEMENT LEFT ARM (Left) for open fracture OPEN REDUCTION INTERNAL FIXATION (ORIF) RADIUS/ULNAR FRACTURE (Left)  SURGEON:  Surgeon(s) and Role:    * Yolonda Kida, MD - Primary  PHYSICIAN ASSISTANT:   ASSISTANTS: none   ANESTHESIA:   regional and general  EBL:  20 cc  BLOOD ADMINISTERED:none  DRAINS: none   LOCAL MEDICATIONS USED:  NONE  SPECIMEN:  No Specimen  DISPOSITION OF SPECIMEN:  N/A  COUNTS:  YES  TOURNIQUET:   Total Tourniquet Time Documented: Upper Arm (Left) - 53 minutes Total: Upper Arm (Left) - 53 minutes   DICTATION: .Note written in EPIC  PLAN OF CARE: Admit for overnight observation  PATIENT DISPOSITION:  PACU - hemodynamically stable.   Delay start of Pharmacological VTE agent (>24hrs) due to surgical blood loss or risk of bleeding: not applicable

## 2020-05-20 NOTE — Anesthesia Postprocedure Evaluation (Signed)
Anesthesia Post Note  Patient: Dale Mack  Procedure(s) Performed: IRRIGATION AND DEBRIDEMENT LEFT ARM (Left Wrist) OPEN REDUCTION INTERNAL FIXATION (ORIF) RADIUS/ULNAR FRACTURE (Left Wrist)     Patient location during evaluation: PACU Anesthesia Type: General Level of consciousness: awake and alert, awake and oriented Pain management: pain level controlled Vital Signs Assessment: post-procedure vital signs reviewed and stable Respiratory status: spontaneous breathing, nonlabored ventilation and respiratory function stable Cardiovascular status: blood pressure returned to baseline and stable Postop Assessment: no apparent nausea or vomiting Anesthetic complications: no   No complications documented.  Last Vitals:  Vitals:   05/20/20 1500 05/20/20 1840  BP: 105/71 (!) 137/80  Pulse: 72 85  Resp: 22 16  Temp:  36.7 C  SpO2: 99% 100%    Last Pain:  Vitals:   05/20/20 1840  TempSrc:   PainSc: 0-No pain                 Cecile Hearing
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
## 2020-05-20 NOTE — Op Note (Signed)
Date of Surgery: 05/20/2020  INDICATIONS: Dale Mack is a 34 y.o.-year-old male with a left type II open both bone forearm fracture of the midshaft radius and ulna.  This was noted to have gross contamination.  He was riding a large 1 wheel type skateboard.  He fell into a gravel he creek bed.  This was a [place] bed.  He presented to the emergency department where there was an obvious deformity of the forearm with the open wound and exposed bone.  He was indicated for urgent operative management with irrigation and debridement of the open fracture with internal fixation of both radius and ulna.;  The patient did consent to the procedure after discussion of the risks and benefits.  PREOPERATIVE DIAGNOSIS:  1.  Type II open left ulna shaft fracture midshaft 2.  Left radius midshaft fracture  POSTOPERATIVE DIAGNOSIS: Same.  PROCEDURE:  1.  Excisional irrigation and debridement for open fracture of left ulna and radius midshaft fractures including bone, subcutaneous tissue, muscle and fascia. 2.  Open reduction internal fixation of left radius and ulna midshaft fractures 3.  Intraoperative fluoroscopic images AP and lateral fluoroscopy of left forearm interpreted independently by myself.  SURGEON: Maryan Rued, M.D.  ASSIST: None.  ANESTHESIA:  general  IV FLUIDS AND URINE: See anesthesia.  ESTIMATED BLOOD LOSS: 20 mL.  IMPLANTS: Synthes 3.5 mm LCP compression plates.  7 hole plate on the ulna with 6 total 3.5 mm cortical nonlocking screws 6-hole 3.5 mm plate on the radius with 6 total 3.5 mm cortical nonlocking screws  DRAINS: None  COMPLICATIONS: None.  Tourniquet time:  53 minutes at 250 mmHg on the left brachium  DESCRIPTION OF PROCEDURE: The patient was brought to the operating room and placed supine on the operating table.  The patient had been signed prior to the procedure and this was documented. The patient had the anesthesia placed by the anesthesiologist.  A time-out  was performed to confirm that this was the correct patient, site, side and location. The patient did receive antibiotics prior to the incision and was re-dosed during the procedure as needed at indicated intervals.  A tourniquet at the upper arm was placed.  The patient had the operative extremity prepped and draped in the standard surgical fashion.     We began the procedure with the excisional and incisional irrigation and debridement for the open fracture of the ulna.  There was a short oblique fracture noted at the midshaft of the ulna.  The proximal fragment had exited the arm and was exposed to loose gravel and soiled debris.  We began by extending the wound as proximally and distally.  The traumatic wound itself was right at the interval between the FCU and ECU.  We extended this proximally and distally with the knife.  We then dissected down to the bone proximally and distally.  We then delivered the fracture fragments through the wound.  We used a curette as well as rondure to debride the bone.  We then used a knife and rondure to debride muscle and subcutaneous tissue and skin that was obviously contaminated with loose debris.  We then copiously irrigated with normal saline.  We then once again debrided the fascia and muscle as well as bone with curette and knife.  Next, we primarily fixed to the ulna bone.  We anatomically aligned the bone fragments and stabilize this with a Kirschner wire.  This was checked with fluoroscopy on AP and lateral views and had  adequate length and rotation.  We then applied a 7 hole plate.  We then applied the proximal and distal adjacent screws to the fracture had good purchase.  We once again brought in fluoroscopy to confirm adequate placement of the plate on the AP and lateral view.  We then filled the remaining 4 holes with a total of 3 screws proximal 3 screws distal to the fracture fragment.  This had excellent compression and stabilization.    We then turned  our attention to the radius.  We utilized a volar approach of Sherilyn Cooter.  We made an incision just overlying the flexor carpi radialis.  We then incised the fascia of the FCR and retracted it ulnarly.  We then bluntly dissected down into the interval between the brachioradialis and flexor carpi radialis.  We then swept the flexor pollicis longus ulnarly as well as the radial artery.  Superficial branch of the radial nerve was retracted radially.  We then took down the flexor digitorum superficialis as well as the pronator teres from the radial border of the volar aspect of the radius.  This exposed the fracture.  Deep retractors were placed.  We then anatomically aligned this fracture which was a short oblique pattern.  We clamped that in place.  We then applied a 6-hole 3.5 mm compression plate.  We placed 3 screws proximal and distal.  This provided obvious compression across the fracture.  We then confirmed placement of the plate and compression fracture with AP and lateral fluoroscopy intraoperatively.  These were saved and sent to the PACS.  Next we again irrigated both the volar and ulnar wounds.  We then closed in layers with 3-0 Vicryl and 4-0 Prolene.  Standard sterile bandage was then applied.  The arm was then placed in a resting volar splint.  Of note, intraoperatively we checked pronation supination of the forearm which was full without blocks.  Also, the distal radial ulnar joint was stable and felt congruent to the right arm.  The patient was awakened from general anesthetic with no complications.  All counts were correct x2.  He was transported PACU in stable condition.  POSTOPERATIVE PLAN:  He will be nonweightbearing to the left upper extremity.  He can begin elbow flexion extension as tolerated.  He may begin hand and finger motion as tolerated as well.  He should be quiet at the wrist for the next 2 weeks.  We applied a sling to the arm.  He will be admitted for 24 hours of IV antibiotics  given his grossly contaminated fracture.

## 2020-05-20 NOTE — Anesthesia Preprocedure Evaluation (Addendum)
Anesthesia Evaluation  Patient identified by MRN, date of birth, ID band Patient awake    Reviewed: Allergy & Precautions, NPO status , Patient's Chart, lab work & pertinent test results  Airway Mallampati: II  TM Distance: >3 FB Neck ROM: Full    Dental  (+) Teeth Intact, Dental Advisory Given, Chipped,    Pulmonary neg pulmonary ROS,    Pulmonary exam normal breath sounds clear to auscultation       Cardiovascular negative cardio ROS Normal cardiovascular exam Rhythm:Regular Rate:Normal     Neuro/Psych Seizures - (as child),  negative psych ROS   GI/Hepatic negative GI ROS, Neg liver ROS,   Endo/Other  negative endocrine ROS  Renal/GU negative Renal ROS     Musculoskeletal Left open both bone forearm fracture Distal left hand is neurovascularly intact   Abdominal   Peds  Hematology negative hematology ROS (+)   Anesthesia Other Findings Day of surgery medications reviewed with the patient.  Reproductive/Obstetrics                            Anesthesia Physical Anesthesia Plan  ASA: II  Anesthesia Plan: General   Post-op Pain Management:  Regional for Post-op pain   Induction: Intravenous  PONV Risk Score and Plan: 2 and Midazolam, Dexamethasone and Ondansetron  Airway Management Planned: Oral ETT  Additional Equipment:   Intra-op Plan:   Post-operative Plan: Extubation in OR  Informed Consent: I have reviewed the patients History and Physical, chart, labs and discussed the procedure including the risks, benefits and alternatives for the proposed anesthesia with the patient or authorized representative who has indicated his/her understanding and acceptance.     Dental advisory given  Plan Discussed with: CRNA  Anesthesia Plan Comments:         Anesthesia Quick Evaluation

## 2020-05-21 ENCOUNTER — Encounter (HOSPITAL_COMMUNITY): Payer: Self-pay | Admitting: Orthopedic Surgery

## 2020-05-21 LAB — GLUCOSE, CAPILLARY: Glucose-Capillary: 78 mg/dL (ref 70–99)

## 2020-05-21 NOTE — Discharge Instructions (Signed)
-  Maintain postoperative bandages and the splint until your follow-up appointment with Dr. Aundria Rud in 2 weeks.  -No lifting with the left arm.   -You may discontinue your sling as needed for comfort and activities of daily living.  Otherwise it can be worn for comfort.  -You should elevate your left arm as able with your "hand above your heart.  -You should move your fingers and elbow as much as you are able to help reduce swelling.  You can also apply ice to the left forearm.  -For mild to moderate pain use Tylenol and ibuprofen around-the-clock every 6 hours effectively.  For breakthrough pain use oxycodone as necessary.  Your prescriptions have been sent to [facility] on [address] In [place].  -Do not get your left arm wet.  - Return to see Dr. Aundria Rud in 2 weeks for routine postoperative care and suture removal.  - Out of work until follow up with Dr. Aundria Rud

## 2020-05-21 NOTE — Progress Notes (Signed)
Spoke with RN, Darl Pikes, and patient and family.  Plan for dc home after final dose of IV abx.  Will follow up with me in 2 weeks

## 2020-05-21 NOTE — Plan of Care (Signed)
  Problem: Education: Goal: Knowledge of General Education information will improve Description: Including pain rating scale, medication(s)/side effects and non-pharmacologic comfort measures Outcome: Progressing   Problem: Health Behavior/Discharge Planning: Goal: Ability to manage health-related needs will improve Outcome: Progressing   Problem: Activity: Goal: Risk for activity intolerance will decrease Outcome: Progressing   Problem: Coping: Goal: Level of anxiety will decrease Outcome: Progressing   Problem: Elimination: Goal: Will not experience complications related to bowel motility Outcome: Progressing   Problem: Pain Managment: Goal: General experience of comfort will improve Outcome: Progressing   Problem: Safety: Goal: Ability to remain free from injury will improve Outcome: Progressing   Problem: Skin Integrity: Goal: Risk for impaired skin integrity will decrease Outcome: Progressing   

## 2020-05-21 NOTE — Plan of Care (Signed)

## 2020-05-25 NOTE — Discharge Summary (Signed)
Patient ID: Dale Mack MRN: [id number] DOB/AGE: 1986/04/22 34 y.o.  Admit date: 05/20/2020 Discharge date: 05/21/2020  Primary Diagnosis: Left open both bone forearm fracture  Admission Diagnoses:  History reviewed. No pertinent past medical history. Discharge Diagnoses:   Active Problems:   Type I or II open fracture of shaft of left radius with ulna   Open fracture of middle of radius and ulna, left, type I or II, initial encounter  Estimated body mass index is 24.21 kg/m as calculated from the following:   Height as of this encounter: 5\' 6"  (1.676 m).   Weight as of this encounter: 68 kg.  Procedure:  Procedure(s) (LRB): IRRIGATION AND DEBRIDEMENT LEFT ARM (Left) OPEN REDUCTION INTERNAL FIXATION (ORIF) RADIUS/ULNAR FRACTURE (Left)   Consults: None  HPI: Admitted for operative treatment of left type II open both bone forearm and post operative antibiotics. Laboratory Data: Admission on 05/20/2020, Discharged on 05/21/2020  Component Date Value Ref Range Status  . Sodium 05/20/2020 137  135 - 145 mmol/L Final  . Potassium 05/20/2020 4.2  3.5 - 5.1 mmol/L Final   HEMOLYSIS AT THIS LEVEL MAY AFFECT RESULT  . Chloride 05/20/2020 109  98 - 111 mmol/L Final  . CO2 05/20/2020 20* 22 - 32 mmol/L Final  . Glucose, Bld 05/20/2020 133* 70 - 99 mg/dL Final   Glucose reference range applies only to samples taken after fasting for at least 8 hours.  . BUN 05/20/2020 12  6 - 20 mg/dL Final  . Creatinine, Ser 05/20/2020 1.16  0.61 - 1.24 mg/dL Final  . Calcium 05/22/2020 7.7* 8.9 - 10.3 mg/dL Final  . GFR calc non Af Amer 05/20/2020 >60  >60 mL/min Final  . GFR calc Af Amer 05/20/2020 >60  >60 mL/min Final  . Anion gap 05/20/2020 8  5 - 15 Final   Performed at [facility], [address], [place], [place]  . WBC 05/20/2020 14.6* 4.0 - 10.5 K/uL Final  . RBC 05/20/2020 5.18  4.22 - 5.81 MIL/uL Final  . Hemoglobin 05/20/2020 15.3  13.0 - 17.0 g/dL Final  . HCT  05/22/2020 42.9  39 - 52 % Final  . MCV 05/20/2020 82.8  80.0 - 100.0 fL Final  . MCH 05/20/2020 29.5  26.0 - 34.0 pg Final  . MCHC 05/20/2020 35.7  30.0 - 36.0 g/dL Final  . RDW 05/22/2020 12.3  11.5 - 15.5 % Final  . Platelets 05/20/2020 236  150 - 400 K/uL Final  . nRBC 05/20/2020 0.0  0.0 - 0.2 % Final  . Neutrophils Relative % 05/20/2020 86  % Final  . Neutro Abs 05/20/2020 12.4* 1.7 - 7.7 K/uL Final  . Lymphocytes Relative 05/20/2020 7  % Final  . Lymphs Abs 05/20/2020 1.0  0.7 - 4.0 K/uL Final  . Monocytes Relative 05/20/2020 7  % Final  . Monocytes Absolute 05/20/2020 1.1* 0 - 1 K/uL Final  . Eosinophils Relative 05/20/2020 0  % Final  . Eosinophils Absolute 05/20/2020 0.0  0 - 0 K/uL Final  . Basophils Relative 05/20/2020 0  % Final  . Basophils Absolute 05/20/2020 0.0  0 - 0 K/uL Final  . Immature Granulocytes 05/20/2020 0  % Final  . Abs Immature Granulocytes 05/20/2020 0.05  0.00 - 0.07 K/uL Final   Performed at [facility], [address] [place], [place]  . Prothrombin Time 05/20/2020 13.5  11.4 - 15.2 seconds Final  . INR 05/20/2020 1.1  0.8 -  1.2 Final   Comment: (NOTE) INR goal varies based on device and disease states. Performed at [facility], [address], [place], [place] [address]   . SARS Coronavirus 2 05/20/2020 NEGATIVE  NEGATIVE Final   Comment: (NOTE) SARS-CoV-2 target nucleic acids are NOT DETECTED.  The SARS-CoV-2 RNA is generally detectable in upper and lower respiratory specimens during the acute phase of infection. The lowest concentration of SARS-CoV-2 viral copies this assay can detect is 250 copies / mL. A negative result does not preclude SARS-CoV-2 infection and should not be used as the sole basis for treatment or other patient management decisions.  A negative result may occur with improper specimen collection / handling, submission of specimen other than nasopharyngeal swab, presence of viral mutation(s) within  the areas targeted by this assay, and inadequate number of viral copies (<250 copies / mL). A negative result must be combined with clinical observations, patient history, and epidemiological information.  Fact Sheet for Patients:   BoilerBrush.com.cy  Fact Sheet for Healthcare Providers: https://pope.com/  This test is not yet approved or                           cleared by the [place] FDA and has been authorized for detection and/or diagnosis of SARS-CoV-2 by FDA under an Emergency Use Authorization (EUA).  This EUA will remain in effect (meaning this test can be used) for the duration of the COVID-19 declaration under Section 564(b)(1) of the Act, 21 U.S.C. section 360bbb-3(b)(1), unless the authorization is terminated or revoked sooner.  Performed at [facility], [address] [place], [place] [address]   . Alcohol, Ethyl (B) 05/20/2020 <10  <10 mg/dL Final   Comment: (NOTE) Lowest detectable limit for serum alcohol is 10 mg/dL.  For medical purposes only. Performed at [facility], [address] [place], [place] [address]   . Glucose-Capillary 05/21/2020 78  70 - 99 mg/dL Final   Glucose reference range applies only to samples taken after fasting for at least 8 hours.     X-Rays:DG Forearm Left  Result Date: 05/20/2020 CLINICAL DATA:  Fall EXAM: LEFT FOREARM - 2 VIEW COMPARISON:  None. FINDINGS: There are oblique, comminuted, displaced and foreshortened fractures of the mid left radial and ulnar diaphyses. The proximal ulnar fragment appears to protrude through the skin. Cast or bandage material is applied about the left forearm. IMPRESSION: There are oblique, comminuted, displaced and foreshortened fractures of the mid left radial and ulnar diaphyses. The proximal ulnar fragment appears to protrude through the skin. Electronically Signed   By: Lauralyn Primes M.D.   On: 05/20/2020 14:41   CT Head Wo  Contrast  Result Date: 05/20/2020 CLINICAL DATA:  Fall, facial trauma, scalp abrasion EXAM: CT HEAD WITHOUT CONTRAST CT CERVICAL SPINE WITHOUT CONTRAST TECHNIQUE: Multidetector CT imaging of the head and cervical spine was performed following the standard protocol without intravenous contrast. Multiplanar CT image reconstructions of the cervical spine were also generated. COMPARISON:  None. FINDINGS: CT HEAD FINDINGS Brain: Normal anatomic configuration. No abnormal intra or extra-axial mass lesion or fluid collection. No abnormal mass effect or midline shift. No evidence of acute intracranial hemorrhage or infarct. Focal hyperdensity seen superior to the right orbital roof is artifactual related to beam hardening artifact, better visualized on coronal imaging. Ventricular size is normal. Cerebellum unremarkable. Vascular: Unremarkable Skull: Intact Sinuses/Orbits: Left maxillary mucous retention cyst. The remaining paranasal sinuses are  clear. Orbits are unremarkable. Other: Mastoid air cells and middle ear cavities are clear. Mild soft tissue swelling is seen involving the right frontal scalp. CT CERVICAL SPINE FINDINGS Alignment: Normal.  No listhesis. Skull base and vertebrae: The craniocervical junction is unremarkable. Atlantodental interval is normal. Vertebral body height has been preserved. No fracture identified. Soft tissues and spinal canal: The paraspinal soft tissues are unremarkable. Spinal canal is widely patent. Disc levels: The intervertebral disc heights have been preserved. No significant uncovertebral or facet arthrosis. No neural foraminal narrowing identified. Upper chest: The visualized lung apices are unremarkable. Other: None significant IMPRESSION: Mild right frontal scalp swelling. No calvarial fracture. No intracranial injury. No cervical spine fracture. Electronically Signed   By: Helyn NumbersAshesh  Parikh MD   On: 05/20/2020 15:16   CT Cervical Spine Wo Contrast  Result Date:  05/20/2020 CLINICAL DATA:  Fall, facial trauma, scalp abrasion EXAM: CT HEAD WITHOUT CONTRAST CT CERVICAL SPINE WITHOUT CONTRAST TECHNIQUE: Multidetector CT imaging of the head and cervical spine was performed following the standard protocol without intravenous contrast. Multiplanar CT image reconstructions of the cervical spine were also generated. COMPARISON:  None. FINDINGS: CT HEAD FINDINGS Brain: Normal anatomic configuration. No abnormal intra or extra-axial mass lesion or fluid collection. No abnormal mass effect or midline shift. No evidence of acute intracranial hemorrhage or infarct. Focal hyperdensity seen superior to the right orbital roof is artifactual related to beam hardening artifact, better visualized on coronal imaging. Ventricular size is normal. Cerebellum unremarkable. Vascular: Unremarkable Skull: Intact Sinuses/Orbits: Left maxillary mucous retention cyst. The remaining paranasal sinuses are clear. Orbits are unremarkable. Other: Mastoid air cells and middle ear cavities are clear. Mild soft tissue swelling is seen involving the right frontal scalp. CT CERVICAL SPINE FINDINGS Alignment: Normal.  No listhesis. Skull base and vertebrae: The craniocervical junction is unremarkable. Atlantodental interval is normal. Vertebral body height has been preserved. No fracture identified. Soft tissues and spinal canal: The paraspinal soft tissues are unremarkable. Spinal canal is widely patent. Disc levels: The intervertebral disc heights have been preserved. No significant uncovertebral or facet arthrosis. No neural foraminal narrowing identified. Upper chest: The visualized lung apices are unremarkable. Other: None significant IMPRESSION: Mild right frontal scalp swelling. No calvarial fracture. No intracranial injury. No cervical spine fracture. Electronically Signed   By: Helyn NumbersAshesh  Parikh MD   On: 05/20/2020 15:16   DG Hand Complete Right  Result Date: 05/20/2020 CLINICAL DATA:  Fall EXAM: RIGHT  HAND - COMPLETE 3+ VIEW COMPARISON:  None. FINDINGS: There is no evidence of fracture or dislocation. There is no evidence of arthropathy or other focal bone abnormality. Soft tissues are unremarkable. IMPRESSION: No fracture or dislocation of the right hand. Electronically Signed   By: Lauralyn PrimesAlex  Bibbey M.D.   On: 05/20/2020 14:45   DG MINI C-ARM IMAGE ONLY  Result Date: 05/20/2020 There is no interpretation for this exam.  This order is for images obtained during a surgical procedure.  Please See "Surgeries" Tab for more information regarding the procedure.    EKG:No orders found for this or any previous visit.   Hospital Course: Dale Mack is a 34 y.o. who was admitted to Hospital. They were brought to the operating room on 05/20/2020 and underwent Procedure(s): IRRIGATION AND DEBRIDEMENT LEFT ARM OPEN REDUCTION INTERNAL FIXATION (ORIF) RADIUS/ULNAR FRACTURE.  Patient tolerated the procedure well and was later transferred to the recovery room and then to the orthopaedic floor for postoperative care.  They were given PO and IV analgesics for  pain control following their surgery.  They were given 24 hours of postoperative antibiotics of  Anti-infectives (From admission, onward)   Start     Dose/Rate Route Frequency Ordered Stop   05/21/20 0600  ceFAZolin (ANCEF) IVPB 2g/100 mL premix  Status:  Discontinued        2 g 200 mL/hr over 30 Minutes Intravenous On call to O.R. 05/20/20 1410 05/20/20 1414   05/20/20 2200  ceFAZolin (ANCEF) IVPB 2g/100 mL premix       "And" Linked Group Details   2 g 200 mL/hr over 30 Minutes Intravenous Every 8 hours 05/20/20 2057 05/21/20 1800   05/20/20 2200  metroNIDAZOLE (FLAGYL) IVPB 500 mg       "And" Linked Group Details   500 mg 100 mL/hr over 60 Minutes Intravenous Every 8 hours 05/20/20 2057 05/21/20 1837   05/20/20 1400  gentamicin (GARAMYCIN) 100 mg in dextrose 5 % 50 mL IVPB        1.5 mg/kg  68 kg 105 mL/hr over 30 Minutes Intravenous  Once  05/20/20 1316 05/20/20 1559   05/20/20 1300  ceFAZolin (ANCEF) IVPB 2g/100 mL premix        2 g 200 mL/hr over 30 Minutes Intravenous  Once 05/20/20 1257 05/20/20 1520     and started on DVT prophylaxis in the form of None. After completion of his 24 hours of postoperative antibiotics he was appropriate to dc home.  Diet: Regular diet Activity:NWB Follow-up:in 2 weeks Disposition - Home Discharged Condition: good   Discharge Instructions    Call MD / Call 911   Complete by: As directed    If you experience chest pain or shortness of breath, CALL 911 and be transported to the hospital emergency room.  If you develope a fever above 101 F, pus (white drainage) or increased drainage or redness at the wound, or calf pain, call your surgeon's office.   Constipation Prevention   Complete by: As directed    Drink plenty of fluids.  Prune juice may be helpful.  You may use a stool softener, such as Colace (over the counter) 100 mg twice a day.  Use MiraLax (over the counter) for constipation as needed.   Diet - low sodium heart healthy   Complete by: As directed    Increase activity slowly as tolerated   Complete by: As directed      Allergies as of 05/21/2020      Reactions   Sulfa Antibiotics    Unspecified reaction. From childhood      Medication List    TAKE these medications   albuterol 108 (90 Base) MCG/ACT inhaler Commonly known as: Proventil HFA Inhale 2 puffs into the lungs every 4 (four) hours as needed for wheezing or shortness of breath.   HYDROcodone-acetaminophen 5-325 MG tablet Commonly known as: NORCO/VICODIN Take 1 tablet by mouth every 6 (six) hours as needed for moderate pain.   naproxen 500 MG tablet Commonly known as: Naprosyn Take 1 tablet (500 mg total) by mouth 2 (two) times daily with a meal.   predniSONE 20 MG tablet Commonly known as: Deltasone Take 2 tablets (40 mg total) by mouth daily.       Follow-up Information    Yolonda Kida, MD  In 2 weeks.   Specialty: Orthopedic Surgery Why: For suture removal, For wound re-check Contact information: [address] [place] [address] [phone]  Signed: Maryan Rued, MD Orthopaedic Surgery 05/25/2020, 1:34 PM

## 2020-07-23 ENCOUNTER — Other Ambulatory Visit: Payer: Self-pay

## 2020-07-23 ENCOUNTER — Ambulatory Visit: Payer: Self-pay | Attending: Orthopedic Surgery | Admitting: Occupational Therapy

## 2020-07-23 ENCOUNTER — Encounter: Payer: Self-pay | Admitting: Occupational Therapy

## 2020-07-23 DIAGNOSIS — L905 Scar conditions and fibrosis of skin: Secondary | ICD-10-CM

## 2020-07-23 DIAGNOSIS — M25632 Stiffness of left wrist, not elsewhere classified: Secondary | ICD-10-CM

## 2020-07-23 DIAGNOSIS — M79632 Pain in left forearm: Secondary | ICD-10-CM

## 2020-07-23 DIAGNOSIS — M6281 Muscle weakness (generalized): Secondary | ICD-10-CM

## 2020-07-23 NOTE — Therapy (Signed)
[facility] PH[facility] MEDICINE [address] [place], [place], [address] Phone: [phone]   Fax:  [phone]  Occupational Therapy Evaluation  Patient Details  Name: Dale Mack MRN: [id number] Date of Birth: 02/18/1986 Referring Provider (OT): Dr Aundria Rudogers   Encounter Date: 07/23/2020   OT End of Session - 07/23/20 1712    Visit Number 1    Number of Visits 6    Date for OT Re-Evaluation 10/01/20    OT Start Time 1510    OT Stop Time 1617    OT Time Calculation (min) 67 min    Activity Tolerance Patient tolerated treatment well    Behavior During Therapy [facility] for tasks assessed/performed           History reviewed. No pertinent past medical history.  Past Surgical History:  Procedure Laterality Date  . I & D EXTREMITY Left 05/20/2020   Procedure: IRRIGATION AND DEBRIDEMENT LEFT ARM;  Surgeon: Yolonda Kidaogers, Jason Patrick, MD;  [facility];  Service: Orthopedics;  Laterality: Left;  . ORIF ULNAR FRACTURE Left 05/20/2020   Procedure: OPEN REDUCTION INTERNAL FIXATION (ORIF) RADIUS/ULNAR FRACTURE;  Surgeon: Yolonda Kidaogers, Jason Patrick, MD;  [facility];  Service: Orthopedics;  Laterality: Left;    There were no vitals filed for this visit.   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  This was noted to have gross contamination.  He was riding a large 1 wheel type skateboard.  He fell into a gravel he [place] bed.  This was [place].  He presented to the emergency department where there was an obvious deformity of the forearm with the open wound and exposed bone.  He was indicated for urgent operative  management with irrigation and debridement of the open fracture with internal fixation of both radius and ulna.;  The patient did consent to the procedure after discussion of the risks and benefits  POSTOPERATIVE DIAGNOSIS: Same. PROCEDURE: 1.  Excisional irrigation and debridement for open fracture of left ulna and radius midshaft fractures including bone, subcutaneous tissue, muscle and fascia.2.  Open reduction internal fixation of left radius and ulna midshaft fractures3.  Intraoperative fluoroscopic images AP and lateral fluoroscopy of left forearm interpreted independently by myself.  Pt    Patient Stated Goals I want therange , strength and function in my L arm back like it was before so I can do my job, and do all the outdoor activities I like to do    Currently in Pain? Yes    Pain Score 3     Pain Location Wrist    Pain Orientation Left    Pain Descriptors / Indicators Aching;Tightness    Pain Type Surgical pain    Pain Onset More than a month ago    Pain Frequency Intermittent    Aggravating Factors  supination and pronation of forearm              [facility] OT Assessment - 07/23/20 0001      Assessment   Medical Diagnosis L mid shaft ulna  and radius fx with ORIF    Referring Provider (OT) Dr Aundria Rud    Onset Date/Surgical Date 05/20/20    Hand Dominance Right    Next MD Visit --   15th Oct   Prior Therapy --   no     Home  Environment   Lives With Spouse      Prior Function   Vocation Full time employment    Leisure Dale Mack , do all outdoor activites hunting , fishing, ATV , hover boards, kayak       AROM   Right Forearm Pronation 90 Degrees    Right Forearm Supination 90 Degrees    Left Forearm Pronation 75 Degrees    Left Forearm Supination 80 Degrees    Right Wrist Extension 72 Degrees    Right Wrist Flexion 92 Degrees    Right Wrist Radial Deviation 20 Degrees    Right Wrist Ulnar Deviation 32 Degrees    Left Wrist Extension 65 Degrees    Left Wrist Flexion 75  Degrees    Left Wrist Radial Deviation 18 Degrees    Left Wrist Ulnar Deviation 28 Degrees      Strength   Right Hand Grip (lbs) 95    Right Hand Lateral Pinch 20 lbs    Right Hand 3 Point Pinch 22 lbs    Left Hand Grip (lbs) 61    Left Hand Lateral Pinch 15 lbs    Left Hand 3 Point Pinch 18 lbs                    OT Treatments/Exercises (OP) - 07/23/20 0001      LUE Fluidotherapy   Number Minutes Fluidotherapy 10 Minutes    LUE Fluidotherapy Location Hand;Wrist    Comments AROM in all planes for wrist prior to review of HEP          Heat prior to ROM  PROM for sup /pro - wife to help PROM wrist extention - prayer stretch - if no pull - can do table slides PROM for wrist flexion- 2 x 2 min  PROM RD, UD over edge of table  10 reps  Pain should be less than 2/10 pain   Scar massage - hand out provided and cica scar pad for night time use Green firm putty for gripping , pulling and twisting ( doing Sup,pron) too  12 reps  3 days can do 2 sets 2 x day if pain free  And then 5-6 days 3 sets - 2 x day pain free  Do not pick up anything more than 8 lbs - pain free             OT Education - 07/23/20 1711    Education Details findings of eval ,POC and HEP    Person(s) Educated Patient;Spouse    Methods Explanation;Demonstration;Tactile cues;Verbal cues;Handout    Comprehension Verbalized understanding;Returned demonstration;Verbal cues required;Tactile cues required;Need further instruction            OT Short Term Goals - 07/23/20 1716      OT SHORT TERM GOAL #1   Title Pt to be independent in HEP to decrease scar tissue, increase AROM for wrist flexion , ext, sup ,pronation to decrease discomfort with use    Baseline pain can increaes to 3/10 ; scar adhere on volar and ulnar mid forearm - decrease ROM - see flowsheet    Time 4    Period Weeks    Status New  Target Date 08/20/20             OT Long Term Goals - 07/23/20 1717      OT LONG  TERM GOAL #1   Title Pt's L wrist and forearm AROM increase to WNL to return to using hand in IADL's and out door activities withtou pain    Baseline decrease motion see flowsheet - pain 3/10    Time 6    Period Weeks    Status New    Target Date 09/03/20      OT LONG TERM GOAL #2   Title L wrist and forearm strength increase to 5/5 to return back to work and yard work    Baseline last xray did not fully healed - and strength decrease 4+/5 - decrease motion in L forearm and wrist    Time 8    Period Weeks    Status New    Target Date 10/01/20      OT LONG TERM GOAL #3   Title L grip increase with more than 15 lbs and prehension strength increase 2 lbs to return to out door activites and work    Baseline grip R 95, L 61 lbs , lat and 3 point grip R 20-22, L 15 to 18 lbs - not doing any outdoor activities and work    Time 8    Period Weeks    Status New    Target Date 10/01/20                 Plan - 07/23/20 1713    Clinical Impression Statement Pt present for OT eval 9 wks s/p ORIF of L mid shaft fx ulna and radius - pt show decrease AROM for wrist and forearm at end range - with flexion , sup, pron the worse , scar adhesions , some pain with certain movement per pt but not more than 3/10 - decrease grip and prehension - limiting his funcitonal use of L hand and wrist in IADL's    OT Occupational Profile and History Problem Focused Assessment - Including review of records relating to presenting problem    Occupational performance deficits (Please refer to evaluation for details): IADL's;Work;Play;Leisure;Social Participation    Body Structure / Function / Physical Skills Decreased knowledge of precautions;Flexibility;IADL;Pain;UE functional use;ROM;Scar mobility;Strength    Rehab Potential Good    Clinical Decision Making Limited treatment options, no task modification necessary    Comorbidities Affecting Occupational Performance: None    Modification or Assistance to Complete  Evaluation  No modification of tasks or assist necessary to complete eval    OT Frequency 1x / week   1 x wk and later decrease biweekly   OT Duration 8 weeks    OT Treatment/Interventions Self-care/ADL training;Contrast Bath;Fluidtherapy;Paraffin;Therapeutic exercise;Manual Therapy;Passive range of motion;Scar mobilization;Patient/family education           Patient will benefit from skilled therapeutic intervention in order to improve the following deficits and impairments:   Body Structure / Function / Physical Skills: Decreased knowledge of precautions, Flexibility, IADL, Pain, UE functional use, ROM, Scar mobility, Strength       Visit Diagnosis: Stiffness of left wrist, not elsewhere classified - Plan: Ot plan of care cert/re-cert  Stiffness of joint of left forearm - Plan: Ot plan of care cert/re-cert  Muscle weakness (generalized) - Plan: Ot plan of care cert/re-cert  Scar tissue - Plan: Ot plan of care cert/re-cert  Pain in left forearm - Plan: Ot plan of care  cert/re-cert    Problem List Patient Active Problem List   Diagnosis Date Noted  . Type I or II open fracture of shaft of left radius with ulna 05/20/2020  . Open fracture of middle of radius and ulna, left, type I or II, initial encounter 05/20/2020    Oletta Cohn OTR/L,CLT 07/23/2020, 5:23 PM  [facility] [address] [place], [address] Phone: [phone]   Fax:  [phone]  Name: JOANDY BURGET MRN: [id number] Date of Birth: [birth year]

## 2020-07-23 NOTE — Patient Instructions (Signed)
Heat prior to ROM  PROM for sup /pro - wife to help PROM wrist extention - prayer stretch - if no pull - can do table slides PROM for wrist flexion- 2 x 2 min  PROM RD, UD over edge of table  10 reps  Pain should be less than 2/10 pain   Scar massage - hand out provided and cica scar pad for night time use Green firm putty for gripping , pulling and twisting ( doing Sup,pron) too  12 reps  3 days can do 2 sets 2 x day if pain free  And then 5-6 days 3 sets - 2 x day pain free  Do not pick up anything more than 8 lbs - pain free

## 2020-07-30 ENCOUNTER — Encounter: Payer: Self-pay | Admitting: Occupational Therapy

## 2020-07-31 ENCOUNTER — Ambulatory Visit: Payer: Self-pay | Attending: Orthopedic Surgery | Admitting: Occupational Therapy

## 2020-07-31 ENCOUNTER — Other Ambulatory Visit: Payer: Self-pay

## 2020-07-31 DIAGNOSIS — L905 Scar conditions and fibrosis of skin: Secondary | ICD-10-CM | POA: Insufficient documentation

## 2020-07-31 DIAGNOSIS — M79632 Pain in left forearm: Secondary | ICD-10-CM | POA: Insufficient documentation

## 2020-07-31 DIAGNOSIS — M25632 Stiffness of left wrist, not elsewhere classified: Secondary | ICD-10-CM | POA: Insufficient documentation

## 2020-07-31 DIAGNOSIS — M6281 Muscle weakness (generalized): Secondary | ICD-10-CM | POA: Insufficient documentation

## 2020-07-31 NOTE — Therapy (Signed)
[facility] [address] [place], [address] Phone: [phone]   Fax:  [phone]  Occupational Therapy Treatment  Patient Details  Name: Dale Mack MRN: [id number] Date of Birth: [birth year] Referring Provider (OT): Dr Aundria Rud   Encounter Date: 07/31/2020   OT End of Session - 07/31/20 1856    Visit Number 2    Number of Visits 6    Date for OT Re-Evaluation 10/01/20    OT Start Time 1515    OT Stop Time 1607    OT Time Calculation (min) 52 min    Activity Tolerance Patient tolerated treatment well    Behavior During Therapy [facility] for tasks assessed/performed           No past medical history on file.  Past Surgical History:  Procedure Laterality Date  . I & D EXTREMITY Left 05/20/2020   Procedure: IRRIGATION AND DEBRIDEMENT LEFT ARM;  Surgeon: Yolonda Kida, MD;  Location: [facility] OR;  Service: Orthopedics;  Laterality: Left;  . ORIF ULNAR FRACTURE Left 05/20/2020   Procedure: OPEN REDUCTION INTERNAL FIXATION (ORIF) RADIUS/ULNAR FRACTURE;  Surgeon: Yolonda Kida, MD;  Location: [facility] OR;  Service: Orthopedics;  Laterality: Left;    There were no vitals filed for this visit.   Subjective Assessment - 07/31/20 1854    Subjective  I did not do the exercises as you told me the last 2 days- my refrigarator broked and then had trouble replacing it - my wrist hurts when I try and do weight bearing thru my wrist    Pertinent History Dale Mack is a 34 y.o.-year-old male with a left type II open both bone forearm fracture of the midshaft radius and ulna.  This was noted to have gross contamination.  He was riding a large 1 wheel type skateboard.  He fell [place] a gravel [place] bed.  [place] was [place].  He presented to the emergency department where there was an obvious deformity of the forearm with the open wound and exposed bone.  He was indicated for urgent operative management with  irrigation and debridement of the open fracture with internal fixation of both radius and ulna.;  The patient did consent to the procedure after discussion of the risks and benefits  POSTOPERATIVE DIAGNOSIS: Same. PROCEDURE: 1.  Excisional irrigation and debridement for open fracture of left ulna and radius midshaft fractures including bone, subcutaneous tissue, muscle and fascia.2.  Open reduction internal fixation of left radius and ulna midshaft fractures3.  Intraoperative fluoroscopic images AP and lateral fluoroscopy of left forearm interpreted independently by myself.  Pt    Patient Stated Goals I want therange , strength and function in my L arm back like it was before so I can do my job, and do all the outdoor activities I like to do    Currently in Pain? Yes    Pain Score 5     Pain Location Wrist    Pain Orientation Left    Pain Descriptors / Indicators Tightness;Aching;Sore    Pain Type Surgical pain    Pain Onset More than a month ago    Pain Frequency Intermittent    Aggravating Factors  Weight bearing              [facility] OT Assessment - 07/31/20 0001      AROM   Right Forearm Pronation 90 Degrees    Right Forearm Supination 90 Degrees  Left Forearm Pronation 75 Degrees    Left Forearm Supination 85 Degrees    Left Wrist Extension 70 Degrees    Left Wrist Flexion 85 Degrees    Left Wrist Radial Deviation 20 Degrees    Left Wrist Ulnar Deviation 32 Degrees      Strength   Right Hand Grip (lbs) 95    Right Hand Lateral Pinch 20 lbs    Right Hand 3 Point Pinch 22 lbs    Left Hand Grip (lbs) 65    Left Hand Lateral Pinch 16 lbs    Left Hand 3 Point Pinch 18 lbs            Measurement taken - see progress in flow sheet   scars still adhere and causing some decrease pronaton and sup -and pain Wrist extention - weight bearing and ROM past 70 degrees pain on dorsal wrist      OT Treatments/Exercises (OP) - 07/31/20 0001      LUE Paraffin   Number Minutes  Paraffin 8 Minutes    LUE Paraffin Location Hand;Wrist;Forearm    Comments pronation stretch wtih heaitingpad prior to soft tissue and ROM          Pronation stretch done during paraffin 'followed by OT doing AAROM and PROM pronation and supination  20 reps Scar massage done by OT- using mini massager and xtractor with wrist ext and radial devation AAROM  Pt and wife ed on doing kinesiotape - done this date parallel to both scars 30% pull and across 100 % pull  Ed on precautions and extra provided for the week to replace as needed     Pt ed on using his paraffin bath at home   PROM for sup /pro - wife to help and then hammer - close to head - on thight and elbow to side - 12 reps pain free - slight pull Prior to wrist extention- done some gentle traction to wrist , joint mobs in all directions and wife ed to do at home - as well as carpal rolls - 12 reps  Attempted again table slides for wrist extention - no pain after joint mobs  Wife to do at home   Cont with PROM for wrist flexion- 2 x 2 min  PROM RD, UD over edge of table  10 reps  Pain should be less than 2/10 pain   Cont with same Green firm putty for gripping , pulling and twisting ( doing Sup,pron) too  12 reps  3 sets - 2 x day pain free  Do not pick up anything more than 8 lbs - pain free         OT Education - 07/31/20 1856    Education Details progress and changes to HEP    Person(s) Educated Patient;Spouse    Methods Explanation;Demonstration;Tactile cues;Verbal cues;Handout    Comprehension Verbalized understanding;Returned demonstration;Verbal cues required;Tactile cues required;Need further instruction            OT Short Term Goals - 07/23/20 1716      OT SHORT TERM GOAL #1   Title Pt to be independent in HEP to decrease scar tissue, increase AROM for wrist flexion , ext, sup ,pronation to decrease discomfort with use    Baseline pain can increaes to 3/10 ; scar adhere on volar and ulnar mid  forearm - decrease ROM - see flowsheet    Time 4    Period Weeks    Status New    Target  Date 08/20/20             OT Long Term Goals - 07/23/20 1717      OT LONG TERM GOAL #1   Title Pt's L wrist and forearm AROM increase to WNL to return to using hand in IADL's and out door activities withtou pain    Baseline decrease motion see flowsheet - pain 3/10    Time 6    Period Weeks    Status New    Target Date 09/03/20      OT LONG TERM GOAL #2   Title L wrist and forearm strength increase to 5/5 to return back to work and yard work    Baseline last xray did not fully healed - and strength decrease 4+/5 - decrease motion in L forearm and wrist    Time 8    Period Weeks    Status New    Target Date 10/01/20      OT LONG TERM GOAL #3   Title L grip increase with more than 15 lbs and prehension strength increase 2 lbs to return to out door activites and work    Baseline grip R 95, L 61 lbs , lat and 3 point grip R 20-22, L 15 to 18 lbs - not doing any outdoor activities and work    Time 8    Period Weeks    Status New    Target Date 10/01/20                 Plan - 07/31/20 1857    Clinical Impression Statement Pt 10 wks s/p ORIF of L mid shaft fx ulna and radius - pt show increase supination , wrist flexion and extention - but cont to have pain with weightbearing thru palm , decrease pronation -and pain because of scar adhesions - change HEP and ed pt and wife    OT Occupational Profile and History Problem Focused Assessment - Including review of records relating to presenting problem    Occupational performance deficits (Please refer to evaluation for details): IADL's;Work;Play;Leisure;Social Participation    Body Structure / Function / Physical Skills Decreased knowledge of precautions;Flexibility;IADL;Pain;UE functional use;ROM;Scar mobility;Strength    Rehab Potential Good    Clinical Decision Making Limited treatment options, no task modification necessary     Comorbidities Affecting Occupational Performance: None    Modification or Assistance to Complete Evaluation  No modification of tasks or assist necessary to complete eval    OT Frequency 1x / week    OT Duration 8 weeks    OT Treatment/Interventions Self-care/ADL training;Contrast Bath;Fluidtherapy;Paraffin;Therapeutic exercise;Manual Therapy;Passive range of motion;Scar mobilization;Patient/family education    Plan progress with HEP and change as needed    Consulted and Agree with Plan of Care Patient           Patient will benefit from skilled therapeutic intervention in order to improve the following deficits and impairments:   Body Structure / Function / Physical Skills: Decreased knowledge of precautions, Flexibility, IADL, Pain, UE functional use, ROM, Scar mobility, Strength       Visit Diagnosis: Stiffness of left wrist, not elsewhere classified  Muscle weakness (generalized)  Scar tissue  Pain in left forearm  Stiffness of joint of left forearm    Problem List Patient Active Problem List   Diagnosis Date Noted  . Type I or II open fracture of shaft of left radius with ulna 05/20/2020  . Open fracture of middle of radius and ulna, left, type  I or II, initial encounter 05/20/2020    Dale Mack, Dale Mack OTR/L,CLT 07/31/2020, 7:00 PM  [facility] [address] [place], [place], [address] Phone: [phone]   Fax:  [phone]  Name: Dale Mack MRN: [id number] Date of Birth: 06/18/1986

## 2020-08-07 ENCOUNTER — Other Ambulatory Visit: Payer: Self-pay

## 2020-08-07 ENCOUNTER — Ambulatory Visit: Payer: Self-pay | Admitting: Occupational Therapy

## 2020-08-07 DIAGNOSIS — M79632 Pain in left forearm: Secondary | ICD-10-CM

## 2020-08-07 DIAGNOSIS — M25632 Stiffness of left wrist, not elsewhere classified: Secondary | ICD-10-CM

## 2020-08-07 DIAGNOSIS — M6281 Muscle weakness (generalized): Secondary | ICD-10-CM

## 2020-08-07 DIAGNOSIS — L905 Scar conditions and fibrosis of skin: Secondary | ICD-10-CM

## 2020-08-07 NOTE — Therapy (Signed)
[facility] [address] [place], [address] Phone: [phone]   Fax:  [phone]  Occupational Therapy Treatment  Patient Details  Name: Dale Mack MRN: [id number] Date of Birth: 1986-02-02 Referring Provider (OT): Dr Aundria Rud   Encounter Date: 08/07/2020   OT End of Session - 08/07/20 1629    Visit Number 3    Number of Visits 6    Date for OT Re-Evaluation 10/01/20    OT Start Time 1543    OT Stop Time 1615    OT Time Calculation (min) 32 min    Activity Tolerance Patient tolerated treatment well    Behavior During Therapy [facility] for tasks assessed/performed           No past medical history on file.  Past Surgical History:  Procedure Laterality Date  . I & D EXTREMITY Left 05/20/2020   Procedure: IRRIGATION AND DEBRIDEMENT LEFT ARM;  Surgeon: Yolonda Kida, MD;  Location: [facility] OR;  Service: Orthopedics;  Laterality: Left;  . ORIF ULNAR FRACTURE Left 05/20/2020   Procedure: OPEN REDUCTION INTERNAL FIXATION (ORIF) RADIUS/ULNAR FRACTURE;  Surgeon: Yolonda Kida, MD;  Location: [facility] OR;  Service: Orthopedics;  Laterality: Left;    There were no vitals filed for this visit.   Subjective Assessment - 08/07/20 1626    Subjective  I done the exercises about 3 x this week and the taping for my scar she done every day - I see the Dr Friday    Pertinent History Dale Mack is a 34 y.o.-year-old male with a left type II open both bone forearm fracture of the midshaft radius and ulna.  This was noted to have gross contamination.  He was riding a large 1 wheel type skateboard.  He fell into a gravel [place] bed.  This was [place].  He presented to the emergency department where there was an obvious deformity of the forearm with the open wound and exposed bone.  He was indicated for urgent operative management with irrigation and debridement of the open fracture with internal fixation of both  radius and ulna.;  The patient did consent to the procedure after discussion of the risks and benefits  POSTOPERATIVE DIAGNOSIS: Same. PROCEDURE: 1.  Excisional irrigation and debridement for open fracture of left ulna and radius midshaft fractures including bone, subcutaneous tissue, muscle and fascia.2.  Open reduction internal fixation of left radius and ulna midshaft fractures3.  Intraoperative fluoroscopic images AP and lateral fluoroscopy of left forearm interpreted independently by myself.  Pt    Patient Stated Goals I want therange , strength and function in my L arm back like it was before so I can do my job, and do all the outdoor activities I like to do    Currently in Pain? No/denies              [facility] OT Assessment - 08/07/20 0001      AROM   Left Forearm Pronation 85 Degrees    Left Forearm Supination 85 Degrees    Left Wrist Extension 70 Degrees    Left Wrist Flexion 90 Degrees                 Scar massage done by OT-xtractor with wrist ext and radial devation AAROM  And in weight bearing Pt and wife ed on doing kinesiotape again  - cont with doing them parallel to both scars 30% pull and across 100 %  pull  Ed on precautions and extra provided for the week to replace as needed     Pt ed on using his paraffin bath at home   PROM for sup /pro - wife to help  and cont with  hammer - close to head - on thight and elbow to side - 12 reps pain free - slight pull Prior to wrist extention- done some gentle traction to wrist , joint mobs in all directions and wife ed to do at home last time - and again this  - as well as carpal rolls - 12 reps  Manual and joint mobs do decrease pain for pt when done prior to weight bearing - Attempted again table slides for wrist extention - no pain after joint mobs  Wife to do at home   Add this date for pt strengthening - RTB 12 reps - 1 x day pain free Scapula retraction, shoulder extention  D1 and D2 patterns - 12 reps  ELbow  extention and flexion - 12 reps  Pain free -increase 2nd set in 3 days and 6 days - 3 sets  Pt to do every day - not skip days - want to increase resistance next week              OT Education - 08/07/20 1627    Education Details progress and changes to HEP    Person(s) Educated Patient;Spouse    Methods Explanation;Demonstration;Tactile cues;Verbal cues;Handout    Comprehension Verbalized understanding;Returned demonstration;Verbal cues required;Tactile cues required;Need further instruction            OT Short Term Goals - 07/23/20 1716      OT SHORT TERM GOAL #1   Title Pt to be independent in HEP to decrease scar tissue, increase AROM for wrist flexion , ext, sup ,pronation to decrease discomfort with use    Baseline pain can increaes to 3/10 ; scar adhere on volar and ulnar mid forearm - decrease ROM - see flowsheet    Time 4    Period Weeks    Status New    Target Date 08/20/20             OT Long Term Goals - 07/23/20 1717      OT LONG TERM GOAL #1   Title Pt's L wrist and forearm AROM increase to WNL to return to using hand in IADL's and out door activities withtou pain    Baseline decrease motion see flowsheet - pain 3/10    Time 6    Period Weeks    Status New    Target Date 09/03/20      OT LONG TERM GOAL #2   Title L wrist and forearm strength increase to 5/5 to return back to work and yard work    Baseline last xray did not fully healed - and strength decrease 4+/5 - decrease motion in L forearm and wrist    Time 8    Period Weeks    Status New    Target Date 10/01/20      OT LONG TERM GOAL #3   Title L grip increase with more than 15 lbs and prehension strength increase 2 lbs to return to out door activites and work    Baseline grip R 95, L 61 lbs , lat and 3 point grip R 20-22, L 15 to 18 lbs - not doing any outdoor activities and work    Time 8    Period [employer]  Status New    Target Date 10/01/20                 Plan -  08/07/20 1631    Clinical Impression Statement Pt is 11 wks s/p ORIF of L mid shaft fx ulna and radius - pt cont to show increase  AROM in sup , pronation and flexion of wrist- pt has 70 degrees of extention and pain free if doing some joint mobs to wrist /carpal bones prior to weight bearing - add this date strengthennint - with RTB for L UE - pain free- pt to increase sets over the next week    OT Occupational Profile and History Problem Focused Assessment - Including review of records relating to presenting problem    Occupational performance deficits (Please refer to evaluation for details): IADL's;Work;Play;Leisure;Social Participation    Body Structure / Function / Physical Skills Decreased knowledge of precautions;Flexibility;IADL;Pain;UE functional use;ROM;Scar mobility;Strength    Rehab Potential Good    Clinical Decision Making Limited treatment options, no task modification necessary    Comorbidities Affecting Occupational Performance: None    Modification or Assistance to Complete Evaluation  No modification of tasks or assist necessary to complete eval    OT Frequency 1x / week    OT Duration 8 weeks    OT Treatment/Interventions Self-care/ADL training;Contrast Bath;Fluidtherapy;Paraffin;Therapeutic exercise;Manual Therapy;Passive range of motion;Scar mobilization;Patient/family education    Plan progress with HEP and change as needed    Consulted and Agree with Plan of Care Patient           Patient will benefit from skilled therapeutic intervention in order to improve the following deficits and impairments:   Body Structure / Function / Physical Skills: Decreased knowledge of precautions, Flexibility, IADL, Pain, UE functional use, ROM, Scar mobility, Strength       Visit Diagnosis: Muscle weakness (generalized)  Scar tissue  Pain in left forearm  Stiffness of joint of left forearm  Stiffness of left wrist, not elsewhere classified    Problem List Patient Active  Problem List   Diagnosis Date Noted  . Type I or II open fracture of shaft of left radius with ulna 05/20/2020  . Open fracture of middle of radius and ulna, left, type I or II, initial encounter 05/20/2020    Oletta Cohn OTR/L,CLT 08/07/2020, 4:34 PM  [facility] [address], [place], [address] Phone: [phone]   Fax:  [phone]  Name: Dale Mack MRN: [id number] Date of Birth: 1986/01/22

## 2020-08-14 ENCOUNTER — Other Ambulatory Visit: Payer: Self-pay

## 2020-08-14 ENCOUNTER — Ambulatory Visit: Payer: Self-pay | Admitting: Occupational Therapy

## 2020-08-14 DIAGNOSIS — L905 Scar conditions and fibrosis of skin: Secondary | ICD-10-CM

## 2020-08-14 DIAGNOSIS — M6281 Muscle weakness (generalized): Secondary | ICD-10-CM

## 2020-08-14 DIAGNOSIS — M25632 Stiffness of left wrist, not elsewhere classified: Secondary | ICD-10-CM

## 2020-08-14 DIAGNOSIS — M79632 Pain in left forearm: Secondary | ICD-10-CM

## 2020-08-14 NOTE — Therapy (Signed)
[facility] [address], [place], [address] Phone: [phone]   Fax:  [phone]  Occupational Therapy Treatment  Patient Details  Name: Dale Mack MRN: [id number] Date of Birth: [birth year] Referring Provider (OT): Dr Aundria Rud   Encounter Date: 08/14/2020   OT End of Session - 08/14/20 1554    Visit Number 4    Number of Visits 6    Date for OT Re-Evaluation 10/01/20    OT Start Time 1508    OT Stop Time 1545    OT Time Calculation (min) 37 min    Activity Tolerance Patient tolerated treatment well    Behavior During Therapy Dale Mack for tasks assessed/performed           No past medical history on file.  Past Surgical History:  Procedure Laterality Date  . I & D EXTREMITY Left 05/20/2020   Procedure: IRRIGATION AND DEBRIDEMENT LEFT ARM;  Surgeon: Yolonda Kida, MD;  Location: [facility] OR;  Service: Orthopedics;  Laterality: Left;  . ORIF ULNAR FRACTURE Left 05/20/2020   Procedure: OPEN REDUCTION INTERNAL FIXATION (ORIF) RADIUS/ULNAR FRACTURE;  Surgeon: Yolonda Kida, MD;  Location: [facility] OR;  Service: Orthopedics;  Laterality: Left;    There were no vitals filed for this visit.   Subjective Assessment - 08/14/20 1553    Subjective  Seen the Surgeon - he was very happy with my progress and release me to go back to work first week of Nov - had some soreness in forearm from the band but did not do it every day - about 3 x since I seen you -    Pertinent History [name] is a 34 y.o.-year-old male with a left type II open both bone forearm fracture of the midshaft radius and ulna.  This was noted to have gross contamination.  He was riding a large 1 wheel type skateboard.  He [place].  [place].  He presented to the emergency department where there was an obvious deformity of the forearm with the open wound and exposed bone.  He was indicated for urgent  operative management with irrigation and debridement of the open fracture with internal fixation of both radius and ulna.;  The patient did consent to the procedure after discussion of the risks and benefits  POSTOPERATIVE DIAGNOSIS: Same. PROCEDURE: 1.  Excisional irrigation and debridement for open fracture of left ulna and radius midshaft fractures including bone, subcutaneous tissue, muscle and fascia.2.  Open reduction internal fixation of left radius and ulna midshaft fractures3.  Intraoperative fluoroscopic images AP and lateral fluoroscopy of left forearm interpreted independently by myself.  Pt    Patient Stated Goals I want therange , strength and function in my L arm back like it was before so I can do my job, and do all the outdoor activities I like to do    Currently in Pain? No/denies           Pt seen surgeon - release to go back to work Nov first week - pt did only his RTB HEP 3 x this past week and had some pain  Keeping pt on same bands - but to do 2 sets of 12 for 3 days - can increase to 3 sets in 3-4 days if no pain    Scar massage done by OT using coban and ed pt and wife to do - opposite scar massage  to wrist flexion -  And on ulnar forearm - scar mobs using coban - l  Ed on precautions to keep coban in place - do not move  Pt ed on using his paraffin bath at home   2 lbs for wrist in all planes - did attempt 3 lbs - but do not think pt is ready to advance with 3 lbs over the next 10 days-  Pt to increase to 2 sets of 12 with 2 lbs in 3 days -and 6 days to 3 sets if not increase pain    Pt to keep same - RTB 12 reps - 1 x day pain free Scapula retraction, shoulder extention  D1 and D2 patterns - 12 reps  ELbow extention and flexion - 12 reps  Pain free -increase 2nd set in 3 days and 6 days - 3 sets  Pt to do every day - not skip days - want to increase resistance next week - REINFORCE AGAIN                     OT Education - 08/14/20 1554     Education Details progress and changes to HEP    Person(s) Educated Patient;Spouse    Methods Explanation;Demonstration;Tactile cues;Verbal cues;Handout    Comprehension Verbalized understanding;Returned demonstration;Verbal cues required;Tactile cues required;Need further instruction            OT Short Term Goals - 07/23/20 1716      OT SHORT TERM GOAL #1   Title Pt to be independent in HEP to decrease scar tissue, increase AROM for wrist flexion , ext, sup ,pronation to decrease discomfort with use    Baseline pain can increaes to 3/10 ; scar adhere on volar and ulnar mid forearm - decrease ROM - see flowsheet    Time 4    Period Weeks    Status New    Target Date 08/20/20             OT Long Term Goals - 07/23/20 1717      OT LONG TERM GOAL #1   Title Pt's L wrist and forearm AROM increase to WNL to return to using hand in IADL's and out door activities withtou pain    Baseline decrease motion see flowsheet - pain 3/10    Time 6    Period Weeks    Status New    Target Date 09/03/20      OT LONG TERM GOAL #2   Title L wrist and forearm strength increase to 5/5 to return back to work and yard work    Baseline last xray did not fully healed - and strength decrease 4+/5 - decrease motion in L forearm and wrist    Time 8    Period Weeks    Status New    Target Date 10/01/20      OT LONG TERM GOAL #3   Title L grip increase with more than 15 lbs and prehension strength increase 2 lbs to return to out door activites and work    Baseline grip R 95, L 61 lbs , lat and 3 point grip R 20-22, L 15 to 18 lbs - not doing any outdoor activities and work    Time 8    Period Weeks    Status New    Target Date 10/01/20                 Plan - 08/14/20 1555    Clinical  Impression Statement Pt is 12 wks s/p ORIF of L mid shaft fx ulnar and radius - pt cont show increase AROM -but still scar adhesions causing some of his pain in volar forearmand ulnar side of forearm - pt  kept at same RTB for elbow and shoulder conditioning - but need to do daily and increase sets over this week - upgrade to 2 lbs for forearm - need to also do daily and increase to 2 and 3 sets over the next week - was unable to upgrade band or do 3 lbs - scar tissue massage reeducation done again    OT Occupational Profile and History Problem Focused Assessment - Including review of records relating to presenting problem    Occupational performance deficits (Please refer to evaluation for details): IADL's;Work;Play;Leisure;Social Participation    Body Structure / Function / Physical Skills Decreased knowledge of precautions;Flexibility;IADL;Pain;UE functional use;ROM;Scar mobility;Strength    Rehab Potential Good    Clinical Decision Making Limited treatment options, no task modification necessary    Comorbidities Affecting Occupational Performance: None    Modification or Assistance to Complete Evaluation  No modification of tasks or assist necessary to complete eval    OT Frequency 1x / week    OT Duration 8 weeks    OT Treatment/Interventions Self-care/ADL training;Contrast Bath;Fluidtherapy;Paraffin;Therapeutic exercise;Manual Therapy;Passive range of motion;Scar mobilization;Patient/family education    Plan progress with HEP and change as needed    Consulted and Agree with Plan of Care Patient           Patient will benefit from skilled therapeutic intervention in order to improve the following deficits and impairments:   Body Structure / Function / Physical Skills: Decreased knowledge of precautions, Flexibility, IADL, Pain, UE functional use, ROM, Scar mobility, Strength       Visit Diagnosis: Pain in left forearm  Stiffness of joint of left forearm  Stiffness of left wrist, not elsewhere classified  Scar tissue  Muscle weakness (generalized)    Problem List Patient Active Problem List   Diagnosis Date Noted  . Type I or II open fracture of shaft of left radius with  ulna 05/20/2020  . Open fracture of middle of radius and ulna, left, type I or II, initial encounter 05/20/2020    Oletta Cohn 08/14/2020, 3:58 PM  [facility] [address] [place], [address] Phone: [phone]   Fax:  [phone]  Name: Dale Mack MRN: [id number] Date of Birth: 06-12-1986

## 2020-08-23 ENCOUNTER — Other Ambulatory Visit: Payer: Self-pay

## 2020-08-23 ENCOUNTER — Ambulatory Visit: Payer: Self-pay | Admitting: Occupational Therapy

## 2020-08-23 DIAGNOSIS — M25632 Stiffness of left wrist, not elsewhere classified: Secondary | ICD-10-CM

## 2020-08-23 DIAGNOSIS — L905 Scar conditions and fibrosis of skin: Secondary | ICD-10-CM

## 2020-08-23 DIAGNOSIS — M6281 Muscle weakness (generalized): Secondary | ICD-10-CM

## 2020-08-23 DIAGNOSIS — M79632 Pain in left forearm: Secondary | ICD-10-CM

## 2020-08-23 NOTE — Therapy (Signed)
[facility] [address], [place], [address] Phone: [phone]   Fax:  [phone]  Occupational Therapy Treatment  Patient Details  Name: Dale Mack MRN: [id number] Date of Birth: [birth year] Referring Provider (OT): Dr Aundria Rud   Encounter Date: 08/23/2020   OT End of Session - 08/23/20 1741    Visit Number 5    Number of Visits 6    Date for OT Re-Evaluation 10/01/20    OT Start Time 1537    OT Stop Time 1632    OT Time Calculation (min) 55 min    Activity Tolerance Patient tolerated treatment well    Behavior During Therapy [facility] for tasks assessed/performed           No past medical history on file.  Past Surgical History:  Procedure Laterality Date  . I & D EXTREMITY Left 05/20/2020   Procedure: IRRIGATION AND DEBRIDEMENT LEFT ARM;  Surgeon: Yolonda Kida, MD;  Location: [facility] OR;  Service: Orthopedics;  Laterality: Left;  . ORIF ULNAR FRACTURE Left 05/20/2020   Procedure: OPEN REDUCTION INTERNAL FIXATION (ORIF) RADIUS/ULNAR FRACTURE;  Surgeon: Yolonda Kida, MD;  Location: [facility] OR;  Service: Orthopedics;  Laterality: Left;    There were no vitals filed for this visit.   Subjective Assessment - 08/23/20 1706    Subjective  I did the band exercises every day like you told me this time -and up to 3 sets - only missed 2 or 3 days this past week - and worked the scar still - but wrist still hurts when I push down on my hand    Pertinent History [name] is a 34 y.o.-year-old male with a left type II open both bone forearm fracture of the midshaft radius and ulna.  This was noted to have gross contamination.  He was riding a large 1 wheel type skateboard.  He fell into a gravel he [place] bed.  This was [place].  He presented to the emergency department where there was an obvious deformity of the forearm with the open wound and exposed bone.  He was indicated for urgent operative  management with irrigation and debridement of the open fracture with internal fixation of both radius and ulna.;  The patient did consent to the procedure after discussion of the risks and benefits  POSTOPERATIVE DIAGNOSIS: Same. PROCEDURE: 1.  Excisional irrigation and debridement for open fracture of left ulna and radius midshaft fractures including bone, subcutaneous tissue, muscle and fascia.2.  Open reduction internal fixation of left radius and ulna midshaft fractures3.  Intraoperative fluoroscopic images AP and lateral fluoroscopy of left forearm interpreted independently by myself.  Pt    Patient Stated Goals I want therange , strength and function in my L arm back like it was before so I can do my job, and do all the outdoor activities I like to do    Currently in Pain? No/denies              [facility] OT Assessment - 08/23/20 0001      AROM   Left Wrist Extension 75 Degrees (P)     Left Wrist Flexion 90 Degrees (P)               Pt report the surgeon release to go back to work  8th Nov full duty but he do not think he can do it - lot of weight he needs to pick up and  move  And some is 90 -to 100lbs  And 8 hrs is going to be a lot - wonder if can go back maybe 4 hrs for few wks Pt advice to talk to surgeon and his boss    - Scar massage done by OT using coban and ed pt and wife to do - opposite scar massage to wrist flexion -  Done xtractor this date and graston on volar forearm Ed on precautions to keep coban in place - do not move  2 lbs for wrist in all planes -  Pt to increase to 3 sets of 12 with 2 lbs- pain free  Increase pt to green TB for  Scapula retraction, shoulder extention 20 reps D1 and D2 patterns - 12 reps from top 20 reps and from bottom 12 reps  ELbow extention 20 reps And then pull back simulate pulling compound bow - 20 reps  Can increase to 2nd and 3 set for 12 reps  And 20 reps to 30 reps   Pain free   Pt to do every day - not skip days - want  to increase resistance next week- REINFORCE AGAIN                 OT Education - 08/23/20 1708    Education Details progress and changes to HEP    Person(s) Educated Patient;Spouse    Methods Explanation;Demonstration;Tactile cues;Verbal cues;Handout    Comprehension Verbalized understanding;Returned demonstration;Verbal cues required;Tactile cues required;Need further instruction            OT Short Term Goals - 07/23/20 1716      OT SHORT TERM GOAL #1   Title Pt to be independent in HEP to decrease scar tissue, increase AROM for wrist flexion , ext, sup ,pronation to decrease discomfort with use    Baseline pain can increaes to 3/10 ; scar adhere on volar and ulnar mid forearm - decrease ROM - see flowsheet    Time 4    Period Weeks    Status New    Target Date 08/20/20             OT Long Term Goals - 07/23/20 1717      OT LONG TERM GOAL #1   Title Pt's L wrist and forearm AROM increase to WNL to return to using hand in IADL's and out door activities withtou pain    Baseline decrease motion see flowsheet - pain 3/10    Time 6    Period Weeks    Status New    Target Date 09/03/20      OT LONG TERM GOAL #2   Title L wrist and forearm strength increase to 5/5 to return back to work and yard work    Baseline last xray did not fully healed - and strength decrease 4+/5 - decrease motion in L forearm and wrist    Time 8    Period Weeks    Status New    Target Date 10/01/20      OT LONG TERM GOAL #3   Title L grip increase with more than 15 lbs and prehension strength increase 2 lbs to return to out door activites and work    Baseline grip R 95, L 61 lbs , lat and 3 point grip R 20-22, L 15 to 18 lbs - not doing any outdoor activities and work    Time 8    Period Weeks    Status New    [employer]  Date 10/01/20                 Plan - 08/23/20 1742    Clinical Impression Statement Pt is 13 wks s/p ORIF L mid shaft fx ulna and radius - pt show  increase AROM wrist extention- but still pain with weight bearing thru palm , scar adhesions limiting end range flexion of wrist -and able to do 5 lbs Theraband for strengthening- change to band for end range sup /pro    OT Occupational Profile and History Problem Focused Assessment - Including review of records relating to presenting problem    Occupational performance deficits (Please refer to evaluation for details): IADL's;Work;Play;Leisure;Social Participation    Body Structure / Function / Physical Skills Decreased knowledge of precautions;Flexibility;IADL;Pain;UE functional use;ROM;Scar mobility;Strength    Rehab Potential Good    Clinical Decision Making Limited treatment options, no task modification necessary    Comorbidities Affecting Occupational Performance: None    Modification or Assistance to Complete Evaluation  No modification of tasks or assist necessary to complete eval    OT Frequency 1x / week    OT Duration 8 weeks    OT Treatment/Interventions Self-care/ADL training;Contrast Bath;Fluidtherapy;Paraffin;Therapeutic exercise;Manual Therapy;Passive range of motion;Scar mobilization;Patient/family education    Plan progress with HEP and change as needed    Consulted and Agree with Plan of Care Patient           Patient will benefit from skilled therapeutic intervention in order to improve the following deficits and impairments:   Body Structure / Function / Physical Skills: Decreased knowledge of precautions, Flexibility, IADL, Pain, UE functional use, ROM, Scar mobility, Strength       Visit Diagnosis: Stiffness of joint of left forearm  Stiffness of left wrist, not elsewhere classified  Scar tissue  Muscle weakness (generalized)  Pain in left forearm    Problem List Patient Active Problem List   Diagnosis Date Noted  . Type I or II open fracture of shaft of left radius with ulna 05/20/2020  . Open fracture of middle of radius and ulna, left, type I or II,  initial encounter 05/20/2020    Oletta Cohn OTR/L,CLT 08/23/2020, 5:48 PM  [facility] [address] [place], [address] Phone: [phone]   Fax:  [phone]  Name: GOHAN COLLISTER MRN: [id number] Date of Birth: Jun 29, 1986

## 2020-08-30 ENCOUNTER — Ambulatory Visit: Payer: Self-pay | Attending: Orthopedic Surgery | Admitting: Occupational Therapy

## 2020-08-30 ENCOUNTER — Other Ambulatory Visit: Payer: Self-pay

## 2020-08-30 DIAGNOSIS — L905 Scar conditions and fibrosis of skin: Secondary | ICD-10-CM | POA: Insufficient documentation

## 2020-08-30 DIAGNOSIS — M79632 Pain in left forearm: Secondary | ICD-10-CM | POA: Insufficient documentation

## 2020-08-30 DIAGNOSIS — M6281 Muscle weakness (generalized): Secondary | ICD-10-CM | POA: Insufficient documentation

## 2020-08-30 DIAGNOSIS — M25632 Stiffness of left wrist, not elsewhere classified: Secondary | ICD-10-CM | POA: Insufficient documentation

## 2020-08-30 NOTE — Therapy (Signed)
[facility] [address] [place], [address] Phone: [phone]   Fax:  [phone]  Occupational Therapy Treatment  Patient Details  Name: Dale Mack MRN: [id number] Date of Birth: 01/08/1986 Referring Provider (OT): Dr Aundria Rud   Encounter Date: 08/30/2020   OT End of Session - 08/30/20 1553    Visit Number 6    Number of Visits 8    Date for OT Re-Evaluation 10/01/20    OT Start Time 1534    OT Stop Time 1630    OT Time Calculation (min) 56 min    Activity Tolerance Patient tolerated treatment well    Behavior During Therapy [facility] for tasks assessed/performed           No past medical history on file.  Past Surgical History:  Procedure Laterality Date  . I & D EXTREMITY Left 05/20/2020   Procedure: IRRIGATION AND DEBRIDEMENT LEFT ARM;  Surgeon: Yolonda Kida, MD;  Location: [facility] OR;  Service: Orthopedics;  Laterality: Left;  . ORIF ULNAR FRACTURE Left 05/20/2020   Procedure: OPEN REDUCTION INTERNAL FIXATION (ORIF) RADIUS/ULNAR FRACTURE;  Surgeon: Yolonda Kida, MD;  Location: [facility] OR;  Service: Orthopedics;  Laterality: Left;    There were no vitals filed for this visit.   Subjective Assessment - 08/30/20 1552    Subjective  Going back to work MOnday full time- and see what I can do - my boss man said he'll have me do somethings that is not as heavy    Pertinent History Dale Mack is a 34 y.o.-year-old male with a left type II open both bone forearm fracture of the midshaft radius and ulna.  This was noted to have gross contamination.  He was riding a large 1 wheel type skateboard.  He fell [place] a gravel [place] bed.  [place].  He presented to the emergency department where there was an obvious deformity of the forearm with the open wound and exposed bone.  He was indicated for urgent operative management with irrigation and debridement of the open fracture with internal  fixation of both radius and ulna.;  The patient did consent to the procedure after discussion of the risks and benefits  POSTOPERATIVE DIAGNOSIS: Same. PROCEDURE: 1.  Excisional irrigation and debridement for open fracture of left ulna and radius midshaft fractures including bone, subcutaneous tissue, muscle and fascia.2.  Open reduction internal fixation of left radius and ulna midshaft fractures3.  Intraoperative fluoroscopic images AP and lateral fluoroscopy of left forearm interpreted independently by myself.  Pt    Patient Stated Goals I want therange , strength and function in my L arm back like it was before so I can do my job, and do all the outdoor activities I like to do    Currently in Pain? No/denies              [facility] OT Assessment - 08/30/20 0001      AROM   Left Wrist Extension 75 Degrees    Left Wrist Flexion 90 Degrees      Strength   Right Hand Grip (lbs) 95    Right Hand Lateral Pinch 24 lbs    Right Hand 3 Point Pinch 23 lbs    Left Hand Grip (lbs) 90   82 extended arm    Left Hand Lateral Pinch 24 lbs    Left Hand 3 Point Pinch 21 lbs  grip increase greatly since evaluation - see flowsheet          OT Treatments/Exercises (OP) - 08/30/20 0001      LUE Paraffin   Number Minutes Paraffin 8 Minutes    LUE Paraffin Location Hand;Wrist;Forearm    Comments prior to soft tissue              Pt report the surgeon release to go back to work  8th Nov full duty   - Scar massage done by OTusing xtractor with Barbaraann Boys ,mini massager and graston on volar forearm Done kinesiotaping on both distal scar - 2 X done with 100% pull   Increase pt to BlueTB   Scapula retraction, shoulder extention 10-15 reps D1 and D2 patterns - 12 reps from top 20 reps from top -but stay at green band for bottom to top 12 reps  pull back simulate pulling compound bow - 20 reps  Blue TB Pain free   Hold off when going to work and sore          OT Education -  08/30/20 1553    Education Details progress and changes to HEP/ and taping    Person(s) Educated Patient    Methods Explanation;Demonstration;Tactile cues;Verbal cues;Handout    Comprehension Verbalized understanding;Returned demonstration;Verbal cues required;Tactile cues required;Need further instruction            OT Short Term Goals - 07/23/20 1716      OT SHORT TERM GOAL #1   Title Pt to be independent in HEP to decrease scar tissue, increase AROM for wrist flexion , ext, sup ,pronation to decrease discomfort with use    Baseline pain can increaes to 3/10 ; scar adhere on volar and ulnar mid forearm - decrease ROM - see flowsheet    Time 4    Period Weeks    Status New    Target Date 08/20/20             OT Long Term Goals - 07/23/20 1717      OT LONG TERM GOAL #1   Title Pt's L wrist and forearm AROM increase to WNL to return to using hand in IADL's and out door activities withtou pain    Baseline decrease motion see flowsheet - pain 3/10    Time 6    Period Weeks    Status New    Target Date 09/03/20      OT LONG TERM GOAL #2   Title L wrist and forearm strength increase to 5/5 to return back to work and yard work    Baseline last xray did not fully healed - and strength decrease 4+/5 - decrease motion in L forearm and wrist    Time 8    Period Weeks    Status New    Target Date 10/01/20      OT LONG TERM GOAL #3   Title L grip increase with more than 15 lbs and prehension strength increase 2 lbs to return to out door activites and work    Baseline grip R 95, L 61 lbs , lat and 3 point grip R 20-22, L 15 to 18 lbs - not doing any outdoor activities and work    Time 8    Period Weeks    Status New    Target Date 10/01/20                 Plan - 08/30/20 1553    Clinical Impression Statement Pt  is 14 wks s/p ORIF mid shaft fx ulna and radius - pt cont to have some scar adhesions at distal scars - done some taping again and ed to do at home -and  increase theraband to blue - about 7.5 lbs - pt returning to work next week - if sore and tired - lay off HEP - and will check on pt one time next week - if need modifications to work and doing okay returning to work    OT Occupational Profile and History Problem Focused Assessment - Including review of records relating to presenting problem    Occupational performance deficits (Please refer to evaluation for details): IADL's;Work;Play;Leisure;Social Participation    Body Structure / Function / Physical Skills Decreased knowledge of precautions;Flexibility;IADL;Pain;UE functional use;ROM;Scar mobility;Strength    Rehab Potential Good    Clinical Decision Making Limited treatment options, no task modification necessary    Comorbidities Affecting Occupational Performance: None    Modification or Assistance to Complete Evaluation  No modification of tasks or assist necessary to complete eval    OT Frequency 1x / week    OT Duration 8 weeks    OT Treatment/Interventions Self-care/ADL training;Contrast Bath;Fluidtherapy;Paraffin;Therapeutic exercise;Manual Therapy;Passive range of motion;Scar mobilization;Patient/family education    Plan progress with HEP and change as needed    Consulted and Agree with Plan of Care Patient           Patient will benefit from skilled therapeutic intervention in order to improve the following deficits and impairments:   Body Structure / Function / Physical Skills: Decreased knowledge of precautions, Flexibility, IADL, Pain, UE functional use, ROM, Scar mobility, Strength       Visit Diagnosis: Stiffness of joint of left forearm  Stiffness of left wrist, not elsewhere classified  Scar tissue  Muscle weakness (generalized)  Pain in left forearm    Problem List Patient Active Problem List   Diagnosis Date Noted  . Type I or II open fracture of shaft of left radius with ulna 05/20/2020  . Open fracture of middle of radius and ulna, left, type I or II,  initial encounter 05/20/2020    Oletta Cohn OTR/l,CLT 08/30/2020, 4:42 PM  [place] [facility] [address], [place], [address] Phone: [phone]   Fax:  [phone]  Name: Dale Mack MRN: [id number] Date of Birth: [birth year]

## 2020-09-06 ENCOUNTER — Ambulatory Visit: Payer: Self-pay | Admitting: Occupational Therapy

## 2020-09-06 ENCOUNTER — Other Ambulatory Visit: Payer: Self-pay

## 2020-09-06 DIAGNOSIS — L905 Scar conditions and fibrosis of skin: Secondary | ICD-10-CM

## 2020-09-06 DIAGNOSIS — M25632 Stiffness of left wrist, not elsewhere classified: Secondary | ICD-10-CM

## 2020-09-06 DIAGNOSIS — M79632 Pain in left forearm: Secondary | ICD-10-CM

## 2020-09-06 DIAGNOSIS — M6281 Muscle weakness (generalized): Secondary | ICD-10-CM

## 2020-09-07 NOTE — Therapy (Signed)
[place] [address] [place], [address] Phone: [phone]   Fax:  [phone]  Occupational Therapy Treatment  Patient Details  Name: Dale Mack MRN: [id number] Date of Birth: February 04, 1986 Referring Provider (OT): Dr Aundria Rud   Encounter Date: 09/06/2020   OT End of Session - 09/06/20 1600    Visit Number 7    Number of Visits 7    Date for OT Re-Evaluation 09/07/20    OT Start Time 1525    OT Stop Time 1553    OT Time Calculation (min) 28 min    Activity Tolerance Patient tolerated treatment well    Behavior During Therapy [facility] for tasks assessed/performed           No past medical history on file.  Past Surgical History:  Procedure Laterality Date   I & D EXTREMITY Left 05/20/2020   Procedure: IRRIGATION AND DEBRIDEMENT LEFT ARM;  Surgeon: Yolonda Kida, MD;  Location: [facility] OR;  Service: Orthopedics;  Laterality: Left;   ORIF ULNAR FRACTURE Left 05/20/2020   Procedure: OPEN REDUCTION INTERNAL FIXATION (ORIF) RADIUS/ULNAR FRACTURE;  Surgeon: Yolonda Kida, MD;  Location: [facility] OR;  Service: Orthopedics;  Laterality: Left;    There were no vitals filed for this visit.   Subjective Assessment - 09/06/20 1625    Subjective  Did okay at work -did use both hands or ask help when it was something heavy - but otherwise were doing okay - here and there feel pull or stretch in forearm - but then it gets better    Pertinent History Dale Mack is a 34 y.o.-year-old male with a left type II open both bone forearm fracture of the midshaft radius and ulna.  This was noted to have gross contamination.  He was riding a large 1 wheel type skateboard.  He fell into a gravel he creek bed.  This was a [place].  He presented to the emergency department where there was an obvious deformity of the forearm with the open wound and exposed bone.  He was indicated for urgent operative management with  irrigation and debridement of the open fracture with internal fixation of both radius and ulna.;  The patient did consent to the procedure after discussion of the risks and benefits  POSTOPERATIVE DIAGNOSIS: Same. PROCEDURE: 1.  Excisional irrigation and debridement for open fracture of left ulna and radius midshaft fractures including bone, subcutaneous tissue, muscle and fascia.2.  Open reduction internal fixation of left radius and ulna midshaft fractures3.  Intraoperative fluoroscopic images AP and lateral fluoroscopy of left forearm interpreted independently by myself.  Pt    Patient Stated Goals I want therange , strength and function in my L arm back like it was before so I can do my job, and do all the outdoor activities I like to do    Currently in Pain? No/denies              [facility] OT Assessment - 09/07/20 0001      AROM   Right Forearm Pronation 90 Degrees    Right Forearm Supination 90 Degrees    Left Forearm Pronation 90 Degrees    Left Forearm Supination 90 Degrees    Right Wrist Extension 72 Degrees    Right Wrist Flexion 92 Degrees    Left Wrist Extension 75 Degrees    Left Wrist Flexion 90 Degrees      Strength   Right Hand Grip (  lbs) 95    Right Hand Lateral Pinch 24 lbs    Right Hand 3 Point Pinch 23 lbs    Left Hand Grip (lbs) 90    Left Hand Lateral Pinch 24 lbs    Left Hand 3 Point Pinch 21 lbs         Pt report his been back to work since Monday- if something was to heavy - he just did not pick it up - or asked for help  Or used both hands to carry and lift -  Or used body to push instead of arms - to like " bend a hanger/rod"   L wrist AROM increase greatly from [facility] - to WNL  And grip and prehension increase to WNL compare to R hand - see flowsheet  Did m/m to wrist and elbow in all planes - 5/5 and no pain    Scar massage review with pt again - he can cont with that on own - doing massage or use kinesiotaping on mostly ulnar side of forearm( feel  pull with pronation)  - more adhere distally than volar scar  Pt discharge from OT services - and to return for check up with surgeon in Dec               OT Education - 09/06/20 1600    Education Details discharge instructions    Person(s) Educated Other (comment);Patient    Methods Explanation;Demonstration;Tactile cues;Verbal cues;Handout    Comprehension Verbalized understanding;Returned demonstration;Verbal cues required;Tactile cues required;Need further instruction            OT Short Term Goals - 09/07/20 1031      OT SHORT TERM GOAL #1   Title Pt to be independent in HEP to decrease scar tissue, increase AROM for wrist flexion , ext, sup ,pronation to decrease discomfort with use    Baseline no pain with m/m , cont with scar massage or taping to ulnar scar - AROM WNL    Status Achieved             OT Long Term Goals - 09/07/20 1032      OT LONG TERM GOAL #1   Title Pt's L wrist and forearm AROM increase to WNL to return to using hand in IADL's and out door activities withtou pain    Status Achieved      OT LONG TERM GOAL #2   Title L wrist and forearm strength increase to 5/5 to return back to work and yard work    Status Achieved      OT LONG TERM GOAL #3   Title L grip increase with more than 15 lbs and prehension strength increase 2 lbs to return to out door activites and work    Baseline grip R 95, L 90  lbs ,prehension WNL - returned to work    Status Achieved                 Plan - 09/06/20 1600    Clinical Impression Statement Pt is 15 wks s/p ORIF mid shaft fx ulna and radius - return this date for check up after returning to work this past week - doing well - did not had any issues- and he ask for help if something is to heavy or use both hands- pt to just focus on functional strengthening and conditioning returning to work -and can cont  with scar mobs - but otherwise strength in L wrist and forearm WFL - no  pain with m/m - pt discharge  at this time - has appt with surgeon in Dec    OT Occupational Profile and History Problem Focused Assessment - Including review of records relating to presenting problem    Occupational performance deficits (Please refer to evaluation for details): IADL's;Work;Play;Leisure;Social Participation    Body Structure / Function / Physical Skills Decreased knowledge of precautions;Flexibility;IADL;Pain;UE functional use;ROM;Scar mobility;Strength    Rehab Potential Good    Clinical Decision Making Limited treatment options, no task modification necessary    Comorbidities Affecting Occupational Performance: None    Modification or Assistance to Complete Evaluation  No modification of tasks or assist necessary to complete eval    OT Treatment/Interventions Self-care/ADL training;Contrast Bath;Fluidtherapy;Paraffin;Therapeutic exercise;Manual Therapy;Passive range of motion;Scar mobilization;Patient/family education    Plan discharge - returned back to work this past week    Consulted and Agree with Plan of Care Patient           Patient will benefit from skilled therapeutic intervention in order to improve the following deficits and impairments:   Body Structure / Function / Physical Skills: Decreased knowledge of precautions, Flexibility, IADL, Pain, UE functional use, ROM, Scar mobility, Strength       Visit Diagnosis: Stiffness of joint of left forearm  Stiffness of left wrist, not elsewhere classified  Scar tissue  Muscle weakness (generalized)  Pain in left forearm    Problem List Patient Active Problem List   Diagnosis Date Noted   Type I or II open fracture of shaft of left radius with ulna 05/20/2020   Open fracture of middle of radius and ulna, left, type I or II, initial encounter 05/20/2020    Oletta Cohn OTR/L,CLT 09/07/2020, 10:36 AM  [facility] [address], [place], [address] Phone:  [phone]   Fax:  [phone]  Name: Dale Mack MRN: [id number] Date of Birth: 1986/01/22

## 2021-11-06 IMAGING — CT CT CERVICAL SPINE W/O CM
3 of 4 series · 12 of 33 positions shown, 14 images · non-contrast
Comparison: None.

CLINICAL DATA: Fall, facial trauma, scalp abrasion

EXAM:
CT HEAD WITHOUT CONTRAST
CT CERVICAL SPINE WITHOUT CONTRAST
TECHNIQUE: Multidetector CT imaging of the head and cervical spine was
performed following the standard protocol without intravenous
contrast. Multiplanar CT image reconstructions of the cervical spine
were also generated.

[Series 8: sag bone · sagittal · 0.26mm/px · 5 of 64 slices shown, 6 images]
[im 22/64  bone]
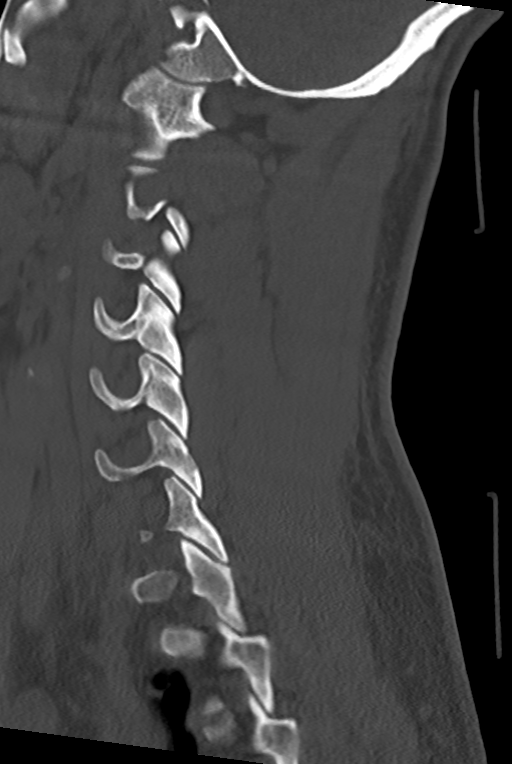
[im 27/64  bone]
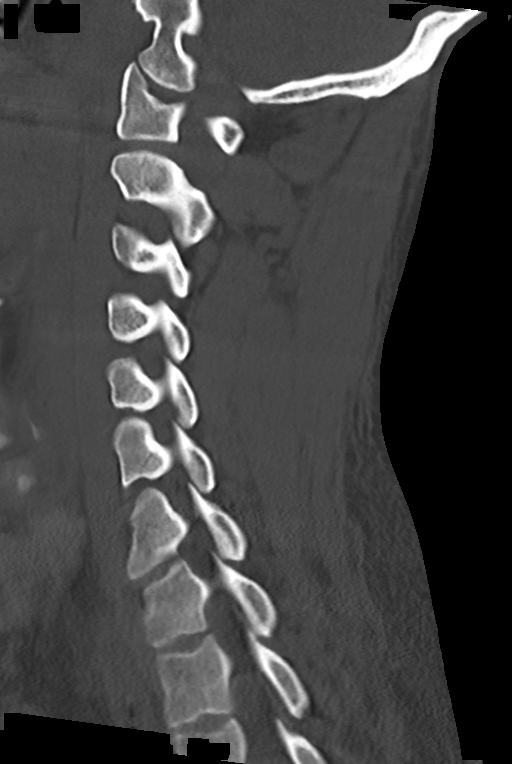
[im 32/64  soft-tissue]
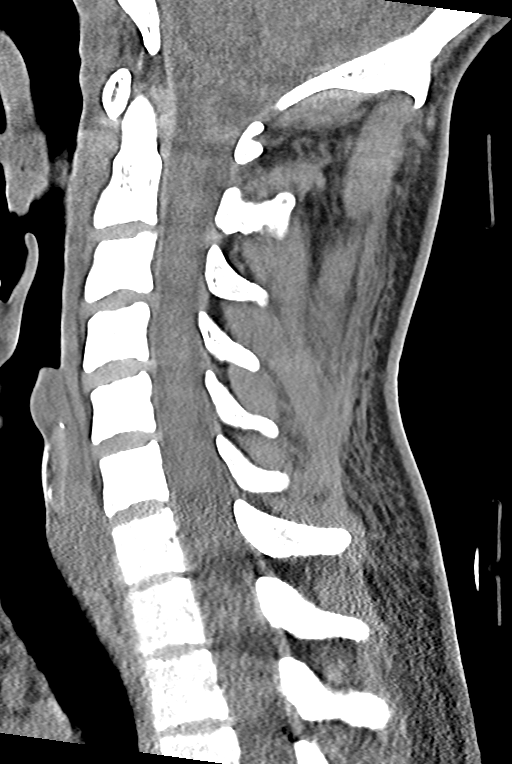
[im 32/64  bone]
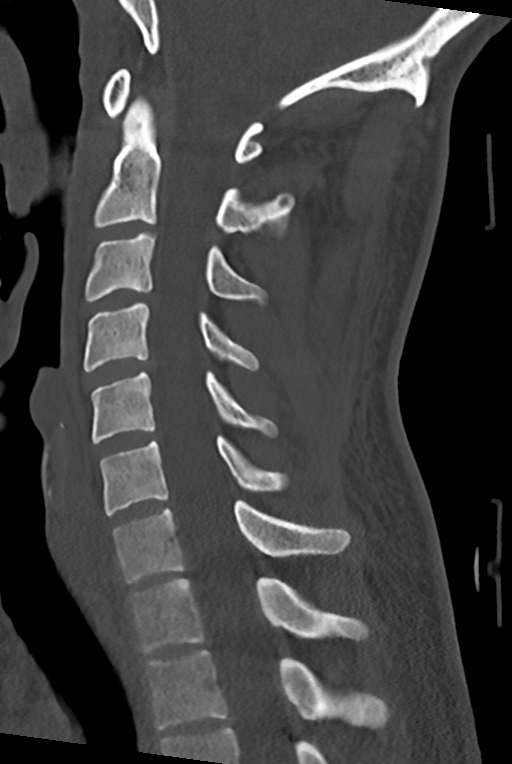
[im 37/64  bone]
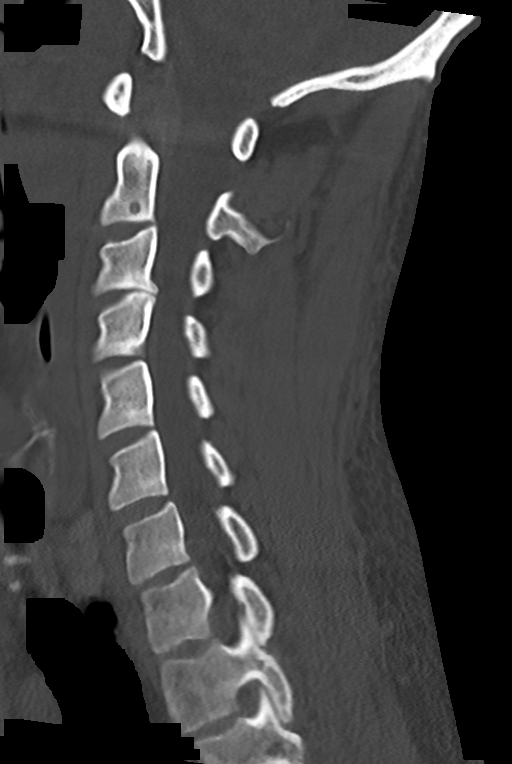
[im 43/64  bone]
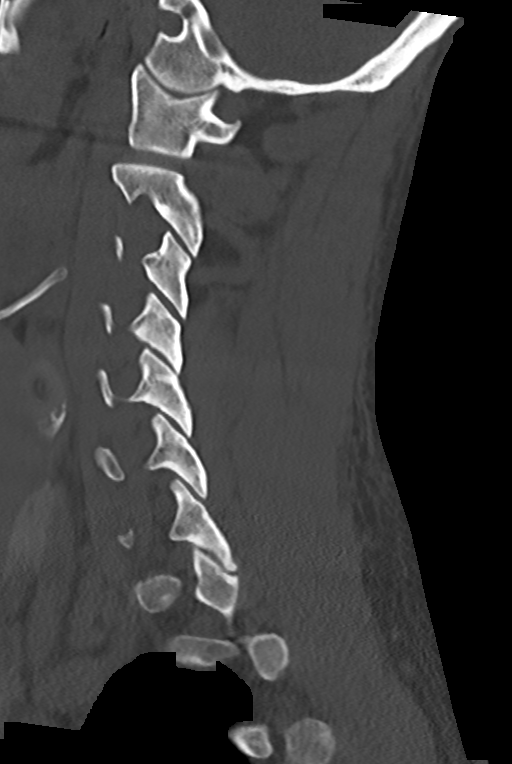

[Series 9: cor bone · coronal · 0.25mm/px · 3 of 67 slices shown]
[im 14/67  bone]
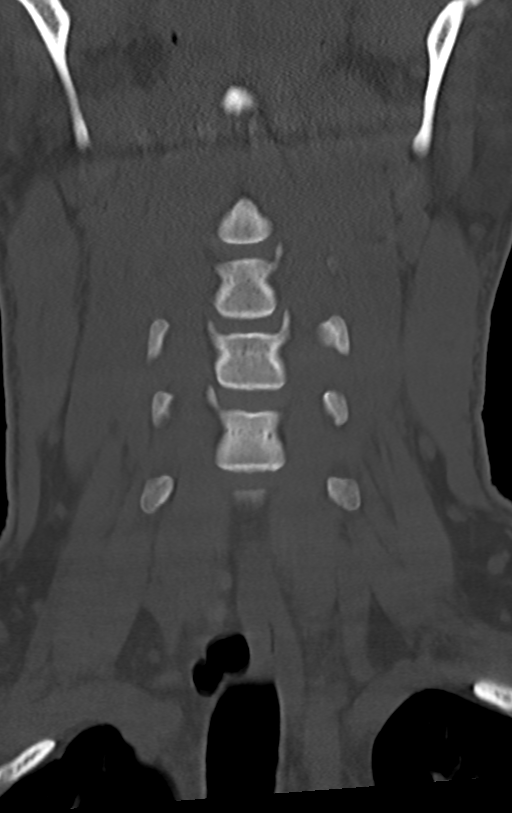
[im 27/67  bone]
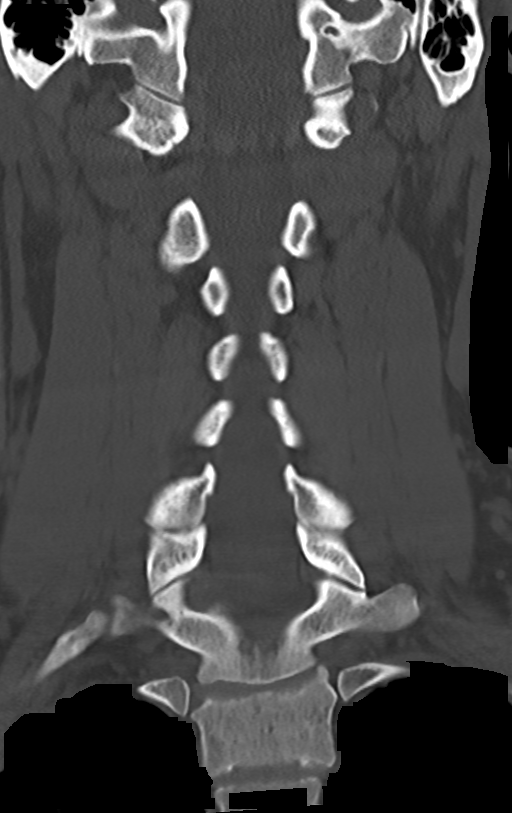
[im 40/67  bone]
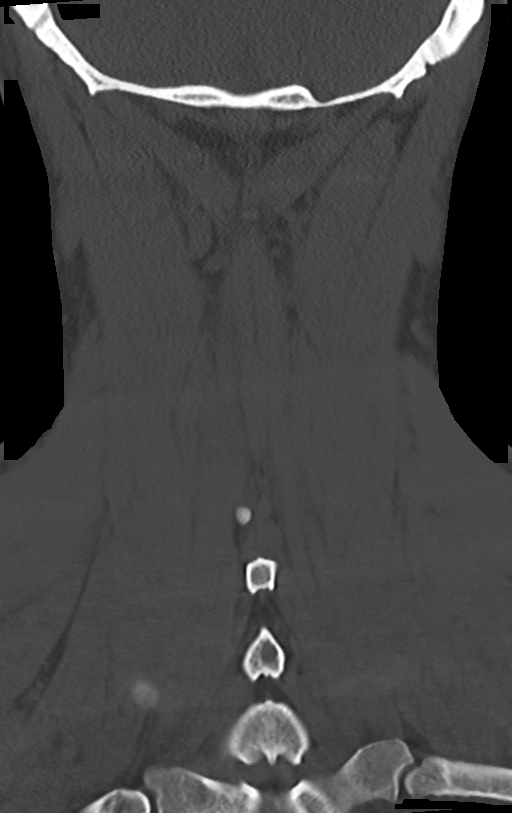

[Series 10: orthogonal axials · axial · 0.21mm/px · z∈[-308,-178]mm · 4 of 102 slices shown, 5 images]
[im 17/102  soft-tissue]
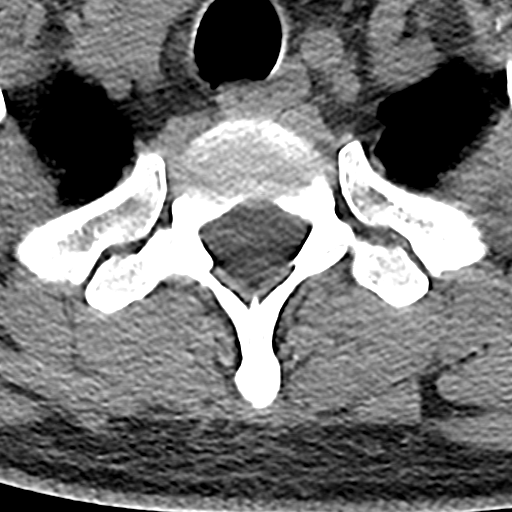
[im 17/102  bone]
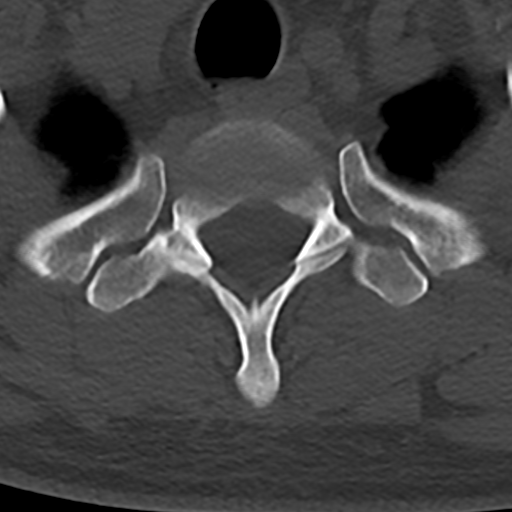
[im 34/102  bone]
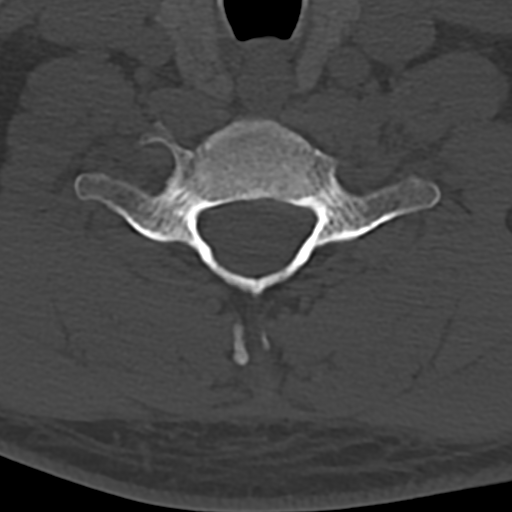
[im 68/102  bone]
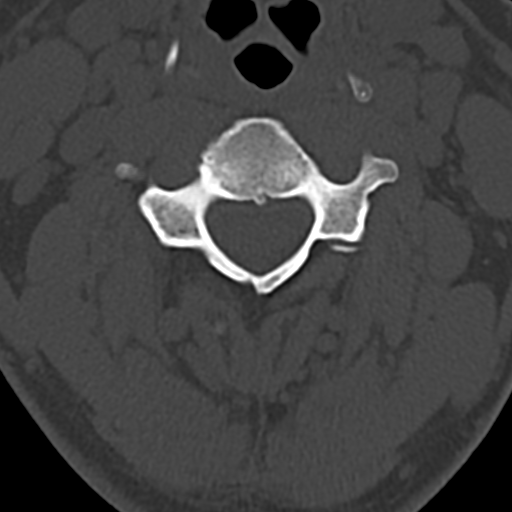
[im 85/102  bone]
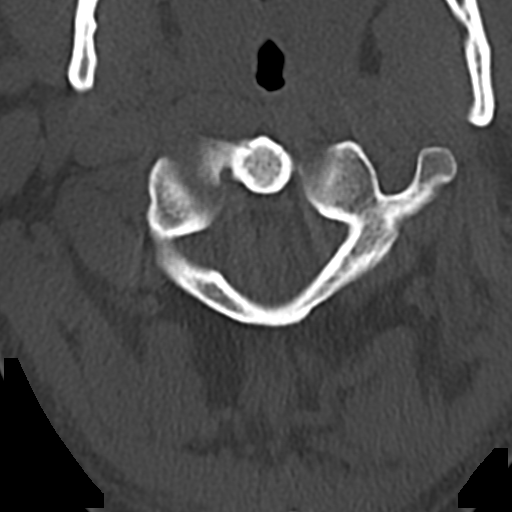

[12 of 33 positions shown; findings below may reference images not displayed]

FINDINGS: CT HEAD FINDINGS

Brain: Normal anatomic configuration. No abnormal intra or
extra-axial mass lesion or fluid collection. No abnormal mass effect
or midline shift. No evidence of acute intracranial hemorrhage or
infarct. Focal hyperdensity seen superior to the right orbital roof
is artifactual related to beam hardening artifact, better visualized
on coronal imaging. Ventricular size is normal. Cerebellum
unremarkable.

Vascular: Unremarkable

Skull: Intact

Sinuses/Orbits: Left maxillary mucous retention cyst. The remaining
paranasal sinuses are clear. Orbits are unremarkable.

Other: Mastoid air cells and middle ear cavities are clear. Mild
soft tissue swelling is seen involving the right frontal scalp.

CT CERVICAL SPINE FINDINGS

Alignment: Normal.  No listhesis.

Skull base and vertebrae: The craniocervical junction is
unremarkable. Atlantodental interval is normal. Vertebral body
height has been preserved. No fracture identified.

Soft tissues and spinal canal: The paraspinal soft tissues are
unremarkable. Spinal canal is widely patent.

Disc levels: The intervertebral disc heights have been preserved. No
significant uncovertebral or facet arthrosis. No neural foraminal
narrowing identified.

Upper chest: The visualized lung apices are unremarkable.

Other: None significant
IMPRESSION: Mild right frontal scalp swelling. No calvarial fracture. No
intracranial injury.

No cervical spine fracture.

## 2021-11-06 IMAGING — CT CT HEAD W/O CM
4 series · 16 of 47 positions shown, 18 images · non-contrast
Comparison: None.

CLINICAL DATA: Fall, facial trauma, scalp abrasion

EXAM:
CT HEAD WITHOUT CONTRAST
CT CERVICAL SPINE WITHOUT CONTRAST
TECHNIQUE: Multidetector CT imaging of the head and cervical spine was
performed following the standard protocol without intravenous
contrast. Multiplanar CT image reconstructions of the cervical spine
were also generated.

[Series 3: head wo · axial · 0.39mm/px · z∈[-136,-16]mm · 7 of 33 slices shown, 9 images]
[im 5/33  brain]
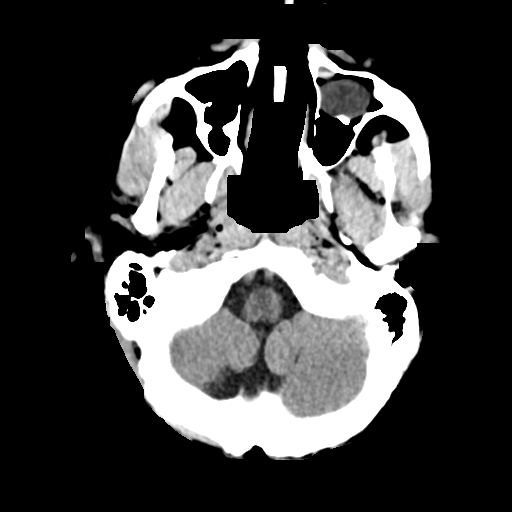
[im 5/33  bone]
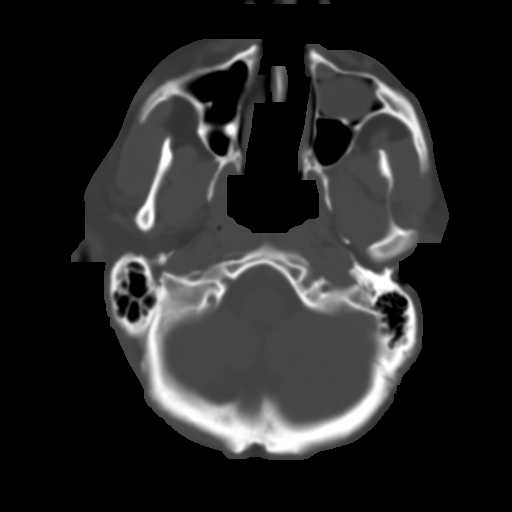
[im 9/33  brain]
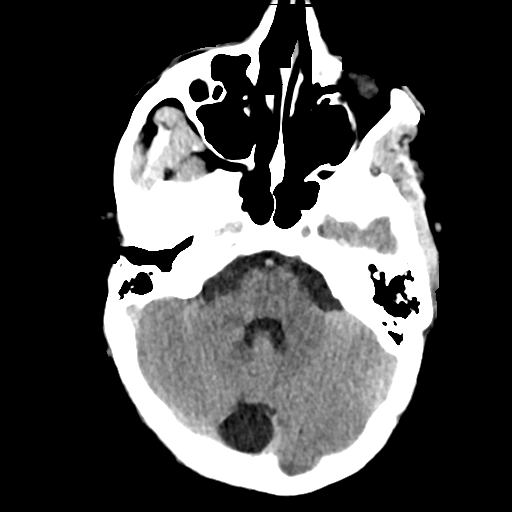
[im 13/33  brain]
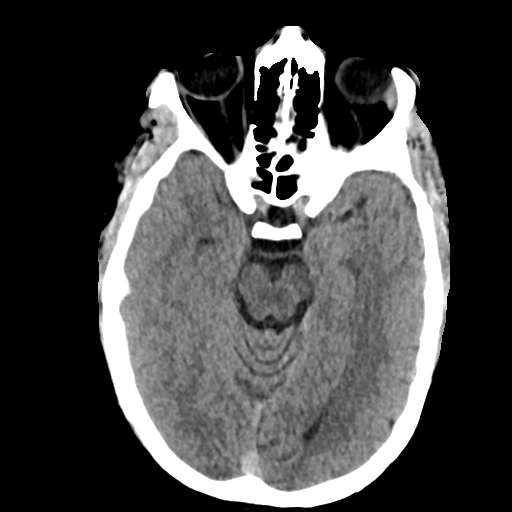
[im 17/33  brain]
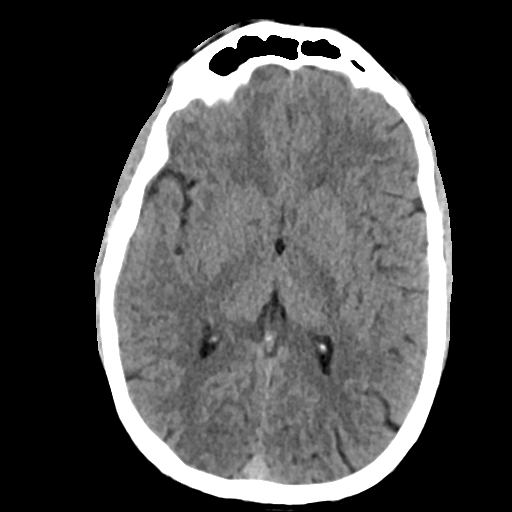
[im 21/33  brain]
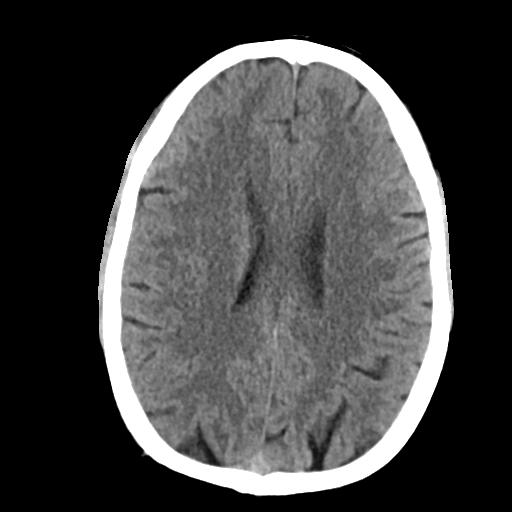
[im 21/33  bone]
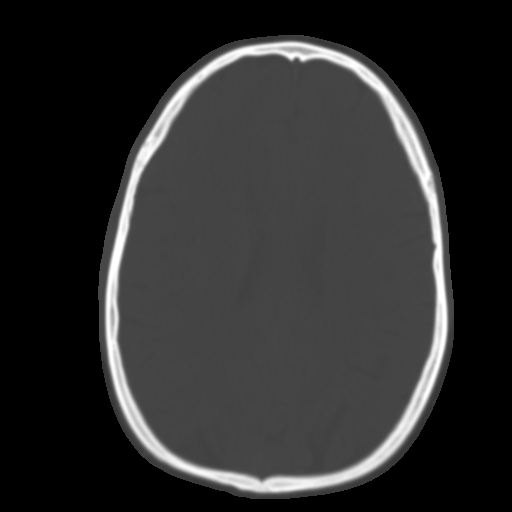
[im 25/33  brain]
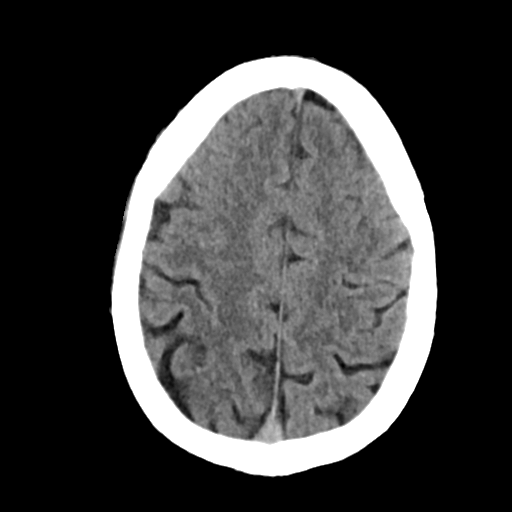
[im 29/33  brain]
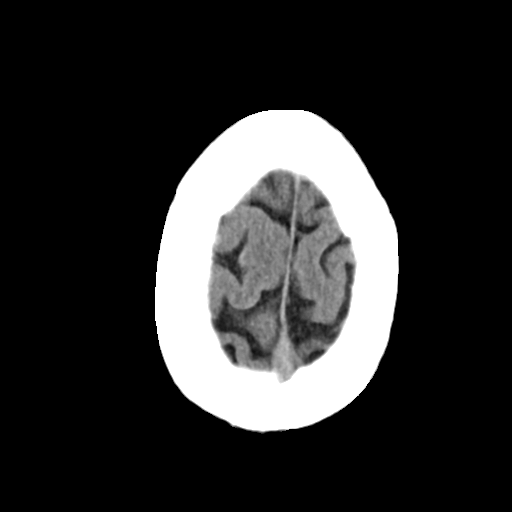

[Series 4: head bone · axial · 0.39mm/px · z∈[-140,-108]mm · 3 of 83 slices shown]
[im 9/83  bone]
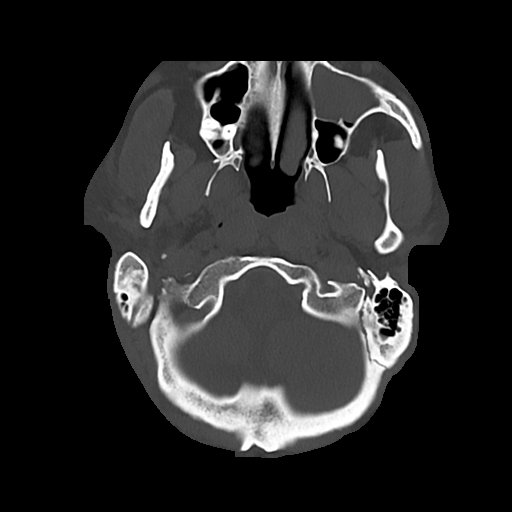
[im 17/83  bone]
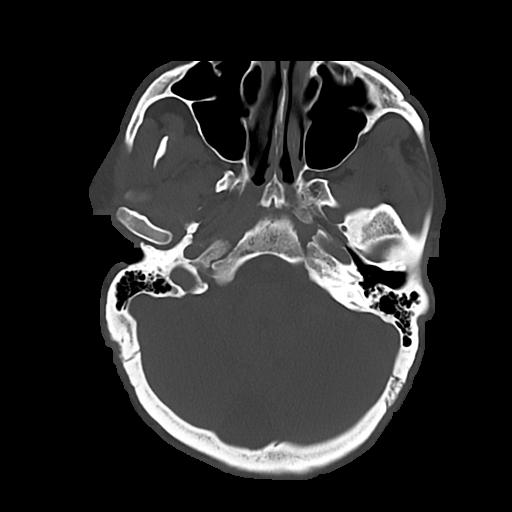
[im 25/83  bone]
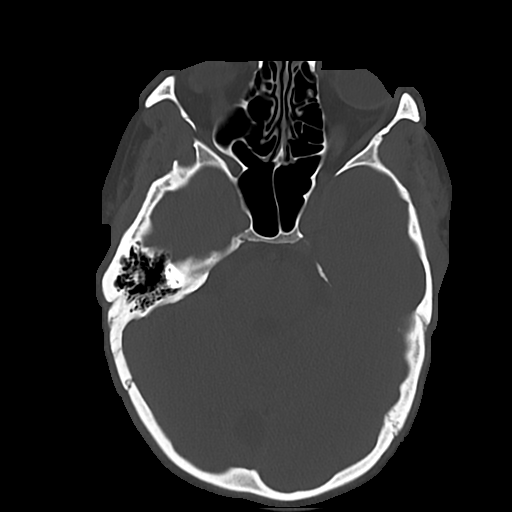

[Series 5: cor soft · coronal · 0.33mm/px · 3 of 69 slices shown]
[im 23/69  brain]
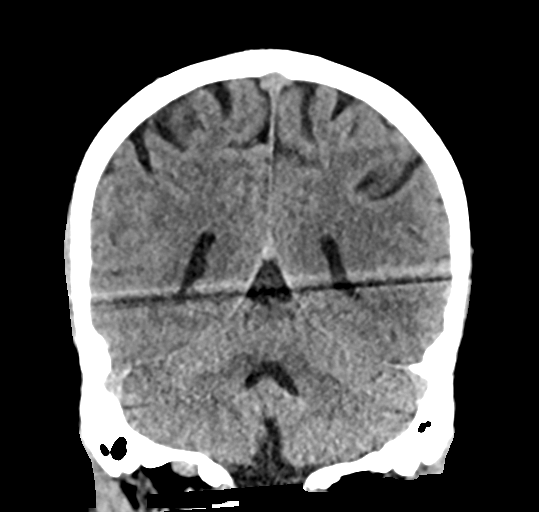
[im 31/69  brain]
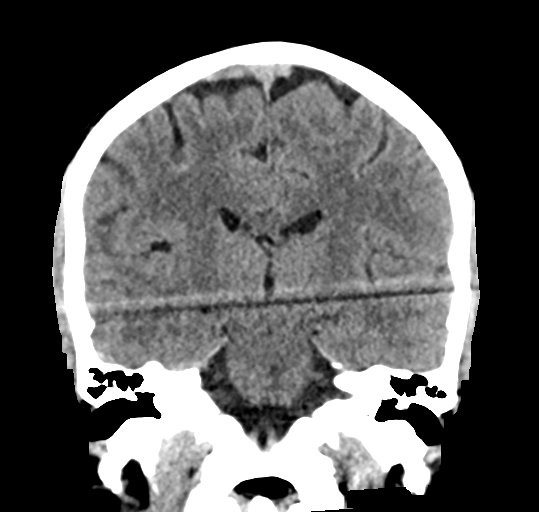
[im 38/69  brain]
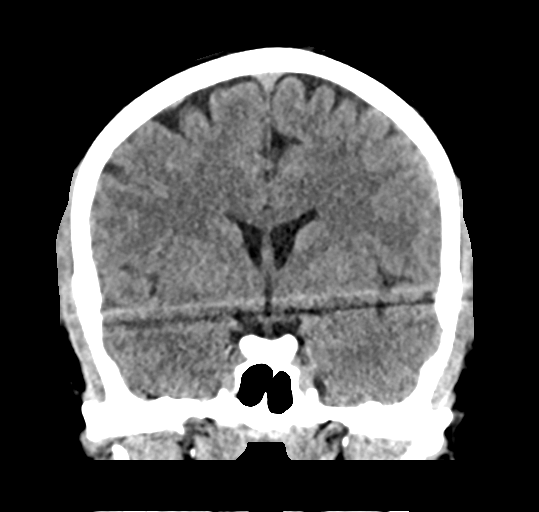

[Series 6: sag soft · sagittal · 0.33mm/px · 3 of 56 slices shown]
[im 19/56  brain]
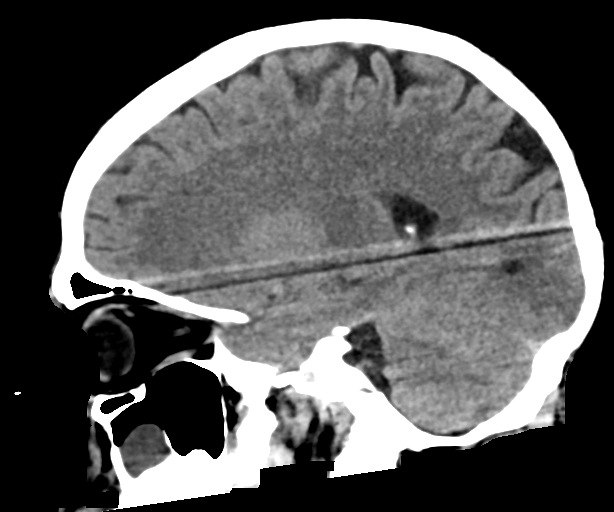
[im 28/56  brain]
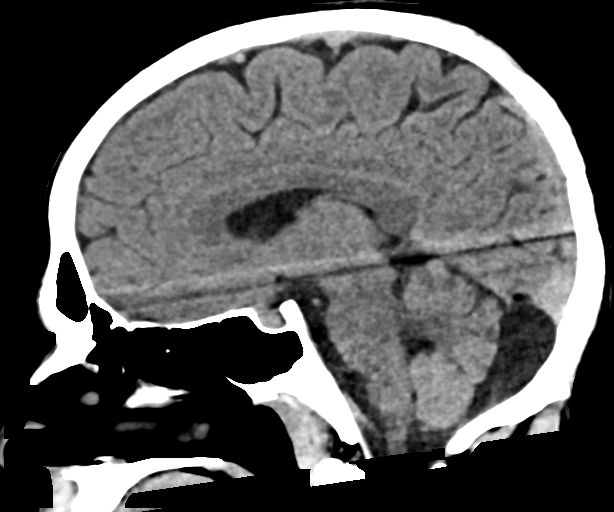
[im 37/56  brain]
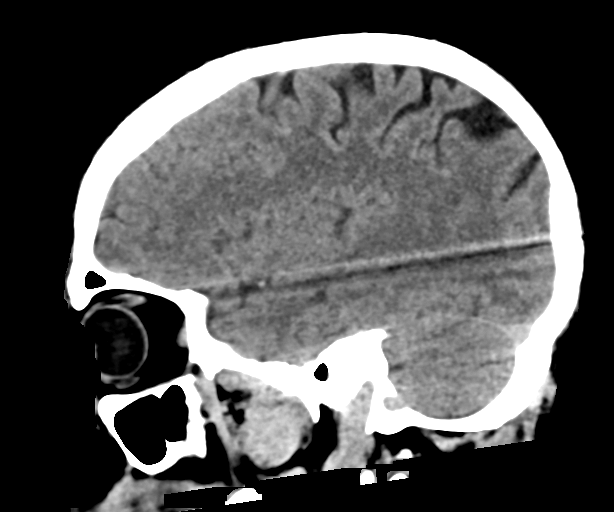

[16 of 47 positions shown; findings below may reference images not displayed]

FINDINGS: CT HEAD FINDINGS

Brain: Normal anatomic configuration. No abnormal intra or
extra-axial mass lesion or fluid collection. No abnormal mass effect
or midline shift. No evidence of acute intracranial hemorrhage or
infarct. Focal hyperdensity seen superior to the right orbital roof
is artifactual related to beam hardening artifact, better visualized
on coronal imaging. Ventricular size is normal. Cerebellum
unremarkable.

Vascular: Unremarkable

Skull: Intact

Sinuses/Orbits: Left maxillary mucous retention cyst. The remaining
paranasal sinuses are clear. Orbits are unremarkable.

Other: Mastoid air cells and middle ear cavities are clear. Mild
soft tissue swelling is seen involving the right frontal scalp.

CT CERVICAL SPINE FINDINGS

Alignment: Normal.  No listhesis.

Skull base and vertebrae: The craniocervical junction is
unremarkable. Atlantodental interval is normal. Vertebral body
height has been preserved. No fracture identified.

Soft tissues and spinal canal: The paraspinal soft tissues are
unremarkable. Spinal canal is widely patent.

Disc levels: The intervertebral disc heights have been preserved. No
significant uncovertebral or facet arthrosis. No neural foraminal
narrowing identified.

Upper chest: The visualized lung apices are unremarkable.

Other: None significant
IMPRESSION: Mild right frontal scalp swelling. No calvarial fracture. No
intracranial injury.

No cervical spine fracture.
